# Patient Record
Sex: Female | Born: 1960
Health system: Southern US, Community
[De-identification: ages and names within clinical notes are randomized; demographics above are authoritative.]

## PROBLEM LIST (undated history)

## (undated) DIAGNOSIS — I10 Essential (primary) hypertension: Secondary | ICD-10-CM

## (undated) DIAGNOSIS — R600 Localized edema: Secondary | ICD-10-CM

## (undated) DIAGNOSIS — E669 Obesity, unspecified: Secondary | ICD-10-CM

## (undated) DIAGNOSIS — D6851 Activated protein C resistance: Secondary | ICD-10-CM

## (undated) DIAGNOSIS — F419 Anxiety disorder, unspecified: Secondary | ICD-10-CM

## (undated) DIAGNOSIS — N62 Hypertrophy of breast: Secondary | ICD-10-CM

## (undated) DIAGNOSIS — K219 Gastro-esophageal reflux disease without esophagitis: Secondary | ICD-10-CM

## (undated) HISTORY — DX: Obesity, unspecified: E66.9

## (undated) HISTORY — PX: ROTATOR CUFF REPAIR: SHX139

## (undated) HISTORY — DX: Localized edema: R60.0

## (undated) HISTORY — DX: Hypertrophy of breast: N62

---

## 2003-02-23 ENCOUNTER — Ambulatory Visit (HOSPITAL_BASED_OUTPATIENT_CLINIC_OR_DEPARTMENT_OTHER): Admission: RE | Admit: 2003-02-23 | Discharge: 2003-02-23 | Payer: Self-pay | Admitting: Orthopedic Surgery

## 2004-05-30 ENCOUNTER — Ambulatory Visit (HOSPITAL_COMMUNITY): Admission: RE | Admit: 2004-05-30 | Discharge: 2004-05-30 | Payer: Self-pay | Admitting: Orthopedic Surgery

## 2004-05-30 ENCOUNTER — Ambulatory Visit (HOSPITAL_BASED_OUTPATIENT_CLINIC_OR_DEPARTMENT_OTHER): Admission: RE | Admit: 2004-05-30 | Discharge: 2004-05-30 | Payer: Self-pay | Admitting: Orthopedic Surgery

## 2007-10-17 HISTORY — PX: ABDOMINAL HYSTERECTOMY: SHX81

## 2007-12-15 ENCOUNTER — Ambulatory Visit: Payer: Self-pay | Admitting: Internal Medicine

## 2007-12-16 ENCOUNTER — Ambulatory Visit: Payer: Self-pay | Admitting: Internal Medicine

## 2007-12-23 ENCOUNTER — Ambulatory Visit: Payer: Self-pay | Admitting: Unknown Physician Specialty

## 2007-12-23 ENCOUNTER — Other Ambulatory Visit: Payer: Self-pay

## 2007-12-26 ENCOUNTER — Inpatient Hospital Stay: Payer: Self-pay | Admitting: Unknown Physician Specialty

## 2008-01-15 ENCOUNTER — Ambulatory Visit: Payer: Self-pay | Admitting: Internal Medicine

## 2008-09-04 ENCOUNTER — Ambulatory Visit: Payer: Self-pay

## 2009-12-24 ENCOUNTER — Ambulatory Visit: Payer: Self-pay | Admitting: Internal Medicine

## 2010-04-22 ENCOUNTER — Ambulatory Visit: Payer: Self-pay | Admitting: Unknown Physician Specialty

## 2011-10-12 ENCOUNTER — Emergency Department: Payer: Self-pay | Admitting: Emergency Medicine

## 2011-11-22 ENCOUNTER — Ambulatory Visit: Payer: Self-pay | Admitting: Obstetrics and Gynecology

## 2015-03-24 ENCOUNTER — Ambulatory Visit
Admission: EM | Admit: 2015-03-24 | Discharge: 2015-03-24 | Disposition: A | Discharge status: Home / Self Care | Payer: BLUE CROSS/BLUE SHIELD | Attending: Family Medicine | Admitting: Family Medicine

## 2015-03-24 ENCOUNTER — Other Ambulatory Visit: Payer: Self-pay

## 2015-03-24 DIAGNOSIS — F419 Anxiety disorder, unspecified: Secondary | ICD-10-CM | POA: Diagnosis not present

## 2015-03-24 DIAGNOSIS — R12 Heartburn: Secondary | ICD-10-CM | POA: Diagnosis not present

## 2015-03-24 DIAGNOSIS — J0101 Acute recurrent maxillary sinusitis: Secondary | ICD-10-CM | POA: Diagnosis not present

## 2015-03-24 DIAGNOSIS — R42 Dizziness and giddiness: Secondary | ICD-10-CM | POA: Diagnosis present

## 2015-03-24 DIAGNOSIS — K219 Gastro-esophageal reflux disease without esophagitis: Secondary | ICD-10-CM | POA: Diagnosis not present

## 2015-03-24 DIAGNOSIS — I10 Essential (primary) hypertension: Secondary | ICD-10-CM | POA: Insufficient documentation

## 2015-03-24 DIAGNOSIS — H6593 Unspecified nonsuppurative otitis media, bilateral: Secondary | ICD-10-CM | POA: Diagnosis not present

## 2015-03-24 HISTORY — DX: Anxiety disorder, unspecified: F41.9

## 2015-03-24 HISTORY — DX: Essential (primary) hypertension: I10

## 2015-03-24 HISTORY — DX: Gastro-esophageal reflux disease without esophagitis: K21.9

## 2015-03-24 MED ORDER — GI COCKTAIL ~~LOC~~
30.0000 mL | Freq: Once | ORAL | Status: AC
  Administered 2015-03-24: 30 mL via ORAL

## 2015-03-24 MED ORDER — MECLIZINE HCL 25 MG PO TABS
25.0000 mg | ORAL_TABLET | ORAL | Status: AC
  Administered 2015-03-24: 12.5 mg via ORAL

## 2015-03-24 MED ORDER — ONDANSETRON HCL 4 MG PO TABS: 4.0000 mg | ORAL_TABLET | Freq: Three times a day (TID) | ORAL | Status: DC | PRN

## 2015-03-24 MED ORDER — FLUTICASONE PROPIONATE 50 MCG/ACT NA SUSP: 1.0000 | Freq: Two times a day (BID) | NASAL | Status: DC

## 2015-03-24 MED ORDER — MECLIZINE HCL 25 MG PO TABS: 25.0000 mg | ORAL_TABLET | Freq: Three times a day (TID) | ORAL | Status: AC | PRN

## 2015-03-24 NOTE — ED Notes (Signed)
"  I think I have vertigo" Woke with headache above left eye. Dizzy and nauseated at work. Discomfort above left breast.

## 2015-03-24 NOTE — Discharge Instructions (Signed)
Otitis Media With Effusion Otitis media with effusion is the presence of fluid in the middle ear. This is a common problem in children, which often follows ear infections. It may be present for weeks or longer after the infection. Unlike an acute ear infection, otitis media with effusion refers only to fluid behind the ear drum and not infection. Children with repeated ear and sinus infections and allergy problems are the most likely to get otitis media with effusion. CAUSES  The most frequent cause of the fluid buildup is dysfunction of the eustachian tubes. These are the tubes that drain fluid in the ears to the back of the nose (nasopharynx). SYMPTOMS   The main symptom of this condition is hearing loss. As a result, you or your child may:  Listen to the TV at a loud volume.  Not respond to questions.  Ask "what" often when spoken to.  Mistake or confuse one sound or word for another.  There may be a sensation of fullness or pressure but usually not pain. DIAGNOSIS   Your health care provider will diagnose this condition by examining you or your child's ears.  Your health care provider may test the pressure in you or your child's ear with a tympanometer.  A hearing test may be conducted if the problem persists. TREATMENT   Treatment depends on the duration and the effects of the effusion.  Antibiotics, decongestants, nose drops, and cortisone-type drugs (tablets or nasal spray) may not be helpful.  Children with persistent ear effusions may have delayed language or behavioral problems. Children at risk for developmental delays in hearing, learning, and speech may require referral to a specialist earlier than children not at risk.  You or your child's health care provider may suggest a referral to an ear, nose, and throat surgeon for treatment. The following may help restore normal hearing:  Drainage of fluid.  Placement of ear tubes (tympanostomy tubes).  Removal of adenoids  (adenoidectomy). HOME CARE INSTRUCTIONS   Avoid secondhand smoke.  Infants who are breastfed are less likely to have this condition.  Avoid feeding infants while they are lying flat.  Avoid known environmental allergens.  Avoid people who are sick. SEEK MEDICAL CARE IF:   Hearing is not better in 3 months.  Hearing is worse.  Ear pain.  Drainage from the ear.  Dizziness. MAKE SURE YOU:   Understand these instructions.  Will watch your condition.  Will get help right away if you are not doing well or get worse. Document Released: 11/09/2004 Document Revised: 02/16/2014 Document Reviewed: 04/29/2013 [facility] Patient Information 2015 [place], [place]. This information is not intended to replace advice given to you by your health care provider. Make sure you discuss any questions you have with your health care provider. Sinusitis Sinusitis is redness, soreness, and inflammation of the paranasal sinuses. Paranasal sinuses are air pockets within the bones of your face (beneath the eyes, the middle of the forehead, or above the eyes). In healthy paranasal sinuses, mucus is able to drain out, and air is able to circulate through them by way of your nose. However, when your paranasal sinuses are inflamed, mucus and air can become trapped. This can allow bacteria and other germs to grow and cause infection. Sinusitis can develop quickly and last only a short time (acute) or continue over a long period (chronic). Sinusitis that lasts for more than 12 weeks is considered chronic.  CAUSES  Causes of sinusitis include:  Allergies.  Structural abnormalities, such as  displacement of the cartilage that separates your nostrils (deviated septum), which can decrease the air flow through your nose and sinuses and affect sinus drainage.  Functional abnormalities, such as when the small hairs (cilia) that line your sinuses and help remove mucus do not work properly or are not present. SIGNS AND  SYMPTOMS  Symptoms of acute and chronic sinusitis are the same. The primary symptoms are pain and pressure around the affected sinuses. Other symptoms include:  Upper toothache.  Earache.  Headache.  Bad breath.  Decreased sense of smell and taste.  A cough, which worsens when you are lying flat.  Fatigue.  Fever.  Thick drainage from your nose, which often is green and may contain pus (purulent).  Swelling and warmth over the affected sinuses. DIAGNOSIS  Your health care provider will perform a physical exam. During the exam, your health care provider may:  Look in your nose for signs of abnormal growths in your nostrils (nasal polyps).  Tap over the affected sinus to check for signs of infection.  View the inside of your sinuses (endoscopy) using an imaging device that has a light attached (endoscope). If your health care provider suspects that you have chronic sinusitis, one or more of the following tests may be recommended:  Allergy tests.  Nasal culture. A sample of mucus is taken from your nose, sent to a lab, and screened for bacteria.  Nasal cytology. A sample of mucus is taken from your nose and examined by your health care provider to determine if your sinusitis is related to an allergy. TREATMENT  Most cases of acute sinusitis are related to a viral infection and will resolve on their own within 10 days. Sometimes medicines are prescribed to help relieve symptoms (pain medicine, decongestants, nasal steroid sprays, or saline sprays).  However, for sinusitis related to a bacterial infection, your health care provider will prescribe antibiotic medicines. These are medicines that will help kill the bacteria causing the infection.  Rarely, sinusitis is caused by a fungal infection. In theses cases, your health care provider will prescribe antifungal medicine. For some cases of chronic sinusitis, surgery is needed. Generally, these are cases in which sinusitis recurs  more than 3 times per year, despite other treatments. HOME CARE INSTRUCTIONS   Drink plenty of water. Water helps thin the mucus so your sinuses can drain more easily.  Use a humidifier.  Inhale steam 3 to 4 times a day (for example, sit in the bathroom with the shower running).  Apply a warm, moist washcloth to your face 3 to 4 times a day, or as directed by your health care provider.  Use saline nasal sprays to help moisten and clean your sinuses.  Take medicines only as directed by your health care provider.  If you were prescribed either an antibiotic or antifungal medicine, finish it all even if you start to feel better. SEEK IMMEDIATE MEDICAL CARE IF:  You have increasing pain or severe headaches.  You have nausea, vomiting, or drowsiness.  You have swelling around your face.  You have vision problems.  You have a stiff neck.  You have difficulty breathing. MAKE SURE YOU:   Understand these instructions.  Will watch your condition.  Will get help right away if you are not doing well or get worse. Document Released: 10/02/2005 Document Revised: 02/16/2014 Document Reviewed: 10/17/2011 [facility] Patient Information 2015 [place], [place]. This information is not intended to replace advice given to you by your health care provider. Make sure  you discuss any questions you have with your health care provider. Heartburn Heartburn is a painful, burning sensation in the chest. It may feel worse in certain positions, such as lying down or bending over. It is caused by stomach acid backing up into the tube that carries food from the mouth down to the stomach (lower esophagus).  CAUSES   Large meals.  Certain foods and drinks.  Exercise.  Increased acid production.  Being overweight or obese.  Certain medicines. SYMPTOMS   Burning pain in the chest or lower throat.  Bitter taste in the mouth.  Coughing. DIAGNOSIS  If the usual treatments for heartburn do not  improve your symptoms, then tests may be done to see if there is another condition present. Possible tests may include:  X-rays.  Endoscopy. This is when a tube with a light and a camera on the end is used to examine the esophagus and the stomach.  A test to measure the amount of acid in the esophagus (pH test).  A test to see if the esophagus is working properly (esophageal manometry).  Blood, breath, or stool tests to check for bacteria that cause ulcers. TREATMENT   Your caregiver may tell you to use certain over-the-counter medicines (antacids, acid reducers) for mild heartburn.  Your caregiver may prescribe medicines to decrease the acid in your stomach or protect your stomach lining.  Your caregiver may recommend certain diet changes.  For severe cases, your caregiver may recommend that the head of your bed be elevated on blocks. (Sleeping with more pillows is not an effective treatment as it only changes the position of your head and does not improve the main problem of stomach acid refluxing into the esophagus.) HOME CARE INSTRUCTIONS   Take all medicines as directed by your caregiver.  Raise the head of your bed by putting blocks under the legs if instructed to by your caregiver.  Do not exercise right after eating.  Avoid eating 2 or 3 hours before bed. Do not lie down right after eating.  Eat small meals throughout the day instead of 3 large meals.  Stop smoking if you smoke.  Maintain a healthy weight.  Identify foods and beverages that make your symptoms worse and avoid them. Foods you may want to avoid include:  Peppers.  Chocolate.  High-fat foods, including fried foods.  Spicy foods.  Garlic and onions.  Citrus fruits, including oranges, grapefruit, lemons, and limes.  Food containing tomatoes or tomato products.  Mint.  Carbonated drinks, caffeinated drinks, and alcohol.  Vinegar. SEEK IMMEDIATE MEDICAL CARE IF:  You have severe chest pain  that goes down your arm or into your jaw or neck.  You feel sweaty, dizzy, or lightheaded.  You are short of breath.  You vomit blood.  You have difficulty or pain with swallowing.  You have bloody or black, tarry stools.  You have episodes of heartburn more than 3 times a week for more than 2 weeks. MAKE SURE YOU:  Understand these instructions.  Will watch your condition.  Will get help right away if you are not doing well or get worse. Document Released: 02/18/2009 Document Revised: 12/25/2011 Document Reviewed: 03/19/2011 [facility] Patient Information 2015 [place], [place]. This information is not intended to replace advice given to you by your health care provider. Make sure you discuss any questions you have with your health care provider. Vertigo Vertigo means you feel like you or your surroundings are moving when they are not. Vertigo can be  dangerous if it occurs when you are at work, driving, or performing difficult activities.  CAUSES  Vertigo occurs when there is a conflict of signals sent to your brain from the visual and sensory systems in your body. There are many different causes of vertigo, including:  Infections, especially in the inner ear.  A bad reaction to a drug or misuse of alcohol and medicines.  Withdrawal from drugs or alcohol.  Rapidly changing positions, such as lying down or rolling over in bed.  A migraine headache.  Decreased blood flow to the brain.  Increased pressure in the brain from a head injury, infection, tumor, or bleeding. SYMPTOMS  You may feel as though the world is spinning around or you are falling to the ground. Because your balance is upset, vertigo can cause nausea and vomiting. You may have involuntary eye movements (nystagmus). DIAGNOSIS  Vertigo is usually diagnosed by physical exam. If the cause of your vertigo is unknown, your caregiver may perform imaging tests, such as an MRI scan (magnetic resonance imaging). TREATMENT   Most cases of vertigo resolve on their own, without treatment. Depending on the cause, your caregiver may prescribe certain medicines. If your vertigo is related to body position issues, your caregiver may recommend movements or procedures to correct the problem. In rare cases, if your vertigo is caused by certain inner ear problems, you may need surgery. HOME CARE INSTRUCTIONS   Follow your caregiver's instructions.  Avoid driving.  Avoid operating heavy machinery.  Avoid performing any tasks that would be dangerous to you or others during a vertigo episode.  Tell your caregiver if you notice that certain medicines seem to be causing your vertigo. Some of the medicines used to treat vertigo episodes can actually make them worse in some people. SEEK IMMEDIATE MEDICAL CARE IF:   Your medicines do not relieve your vertigo or are making it worse.  You develop problems with talking, walking, weakness, or using your arms, hands, or legs.  You develop severe headaches.  Your nausea or vomiting continues or gets worse.  You develop visual changes.  A family member notices behavioral changes.  Your condition gets worse. MAKE SURE YOU:  Understand these instructions.  Will watch your condition.  Will get help right away if you are not doing well or get worse. Document Released: 07/12/2005 Document Revised: 12/25/2011 Document Reviewed: 04/20/2011 [facility] Patient Information 2015 [place], [place]. This information is not intended to replace advice given to you by your health care provider. Make sure you discuss any questions you have with your health care provider.

## 2015-03-24 NOTE — ED Provider Notes (Signed)
CSN: [id number]     Arrival date & time 03/24/15  1321 History   First MD Initiated Contact with Patient 03/24/15 1418     Chief Complaint  Patient presents with  . Dizziness   (Consider location/radiation/quality/duration/timing/severity/associated sxs/prior Treatment) HPI Comments: Caucasian female with anxiety unable to reach [facility] Ronnald Ramp after she started to have vertigo, headache, heartburn, dizzyness at work.  Works at [employer] in [place], [place]. This vertigo a bit different than last episode 7 years ago as chest feels like something off not chest pain like fluttering.  That episode vertigo came on suddenly only symptom was given meclizine and resolved same day.  Had sinus problem one month ago took OTC flonase, sudafed resolved.  Denied recent cough, cold.  I am healthy except for being overweight.  Patient is a 54 y.o. female presenting with dizziness. The history is provided by the patient.  Dizziness Quality:  Head spinning and lightheadedness Severity:  Moderate Onset quality:  Sudden Duration:  4 hours Timing:  Constant Progression:  Unchanged Chronicity:  New Context: head movement and standing up   Context: not when bending over, not with bowel movement, not with ear pain, not with eye movement, not with inactivity, not with loss of consciousness, not with medication, not with physical activity and not when urinating   Relieved by:  Nothing Worsened by:  Movement Ineffective treatments:  Change in position, being still, closing eyes, lying down, food and fluids Associated symptoms: chest pain, headaches and nausea   Associated symptoms: no blood in stool, no diarrhea, no hearing loss, no palpitations, no shortness of breath, no syncope, no tinnitus, no vision changes, no vomiting and no weakness   Chest pain:    Quality: pressure     Severity:  Mild   Onset quality:  Sudden   Duration:  4 hours   Timing:  Constant   Progression:  Unchanged   Chronicity:  New Headaches:     Severity:  Mild   Onset quality:  Sudden   Duration:  7 hours   Timing:  Constant   Progression:  Unchanged   Chronicity:  New Nausea:    Severity:  Moderate   Onset quality:  Sudden   Duration:  6 hours   Timing:  Constant   Progression:  Unchanged Risk factors: hx of vertigo   Risk factors: no anemia, no heart disease, no hx of stroke, no Meniere's disease, no multiple medications and no new medications     Past Medical History  Diagnosis Date  . Hypertension   . GERD (gastroesophageal reflux disease)   . Anxiety    Past Surgical History  Procedure Laterality Date  . Abdominal hysterectomy    . Rotator cuff repair     Family History  Problem Relation Age of Onset  . Cancer Mother   . COPD Father    History  Substance Use Topics  . Smoking status: Never Smoker   . Smokeless tobacco: Not on file  . Alcohol Use: Yes     Comment: socially   OB History    No data available     Review of Systems  Constitutional: Negative for fever, chills, diaphoresis, activity change, appetite change and fatigue.  HENT: Negative for congestion, dental problem, drooling, ear discharge, ear pain, facial swelling, hearing loss, mouth sores, nosebleeds, postnasal drip, rhinorrhea, sinus pressure, sneezing, sore throat, tinnitus, trouble swallowing and voice change.   Eyes: Negative for photophobia, pain, discharge, redness, itching and visual disturbance.  Respiratory: Negative for apnea, cough, choking, chest tightness, shortness of breath, wheezing and stridor.   Cardiovascular: Positive for chest pain. Negative for palpitations, leg swelling and syncope.  Gastrointestinal: Positive for nausea. Negative for vomiting, abdominal pain, diarrhea, constipation, blood in stool, abdominal distention, anal bleeding and rectal pain.  Endocrine: Negative for cold intolerance and heat intolerance.  Genitourinary: Negative for dysuria and difficulty urinating.  Musculoskeletal: Negative for  myalgias, back pain, joint swelling, arthralgias, gait problem, neck pain and neck stiffness.  Skin: Negative for color change, pallor, rash and wound.  Allergic/Immunologic: Negative for environmental allergies and food allergies.  Neurological: Positive for dizziness and headaches. Negative for tremors, seizures, syncope, facial asymmetry, speech difficulty, weakness, light-headedness and numbness.  Hematological: Negative for adenopathy. Does not bruise/bleed easily.  Psychiatric/Behavioral: Negative for hallucinations, behavioral problems, confusion, sleep disturbance, self-injury, dysphoric mood, decreased concentration and agitation. The patient is nervous/anxious. The patient is not hyperactive.     Allergies  Review of patient's allergies indicates no known allergies.  Home Medications   Prior to Admission medications   Medication Sig Start Date End Date Taking? Authorizing Provider  ALPRAZolam Duanne Moron) 0.25 MG tablet Take 0.25 mg by mouth at bedtime as needed for anxiety.   Yes Historical Provider, MD  esomeprazole (NEXIUM) 40 MG capsule Take 40 mg by mouth daily at 12 noon.   Yes Historical Provider, MD  lisinopril (PRINIVIL,ZESTRIL) 2.5 MG tablet Take 2.5 mg by mouth daily.   Yes Historical Provider, MD  fluticasone (FLONASE) 50 MCG/ACT nasal spray Place 1 spray into both nostrils 2 (two) times daily. 03/24/15   Olen Cordial, NP  meclizine (ANTIVERT) 25 MG tablet Take 1 tablet (25 mg total) by mouth 3 (three) times daily as needed for dizziness (take at onset max 100mg  per 24 hrs). 03/24/15 03/31/15  Olen Cordial, NP  ondansetron (ZOFRAN) 4 MG tablet Take 1 tablet (4 mg total) by mouth every 8 (eight) hours as needed for nausea or vomiting. 03/24/15   Aura Fey Betancourt, NP   BP 149/84 mmHg  Pulse 96  Temp(Src) 98.4 F (36.9 C) (Oral)  Resp 18  Ht 5\' 3"  (1.6 m)  Wt 230 lb (104.327 kg)  BMI 40.75 kg/m2  SpO2 100% Physical Exam  Constitutional: She is oriented to person,  place, and time. Vital signs are normal. She appears well-developed and well-nourished. She is active.  Non-toxic appearance. She does not have a sickly appearance. She does not appear ill. No distress.  HENT:  Head: Normocephalic and atraumatic.  Right Ear: Hearing, external ear and ear canal normal. A middle ear effusion is present.  Left Ear: Hearing, external ear and ear canal normal. A middle ear effusion is present.  Nose: No mucosal edema, rhinorrhea, nose lacerations, sinus tenderness, nasal deformity, septal deviation or nasal septal hematoma. No epistaxis.  No foreign bodies. Right sinus exhibits maxillary sinus tenderness. Right sinus exhibits no frontal sinus tenderness. Left sinus exhibits maxillary sinus tenderness. Left sinus exhibits no frontal sinus tenderness.  Mouth/Throat: Uvula is midline and mucous membranes are normal. She does not have dentures. No oral lesions. No trismus in the jaw. Normal dentition. No dental abscesses, uvula swelling, lacerations or dental caries. Posterior oropharyngeal edema and posterior oropharyngeal erythema present. No oropharyngeal exudate or tonsillar abscesses.  Bilateral TMs with air fluid level clear; cobblestoning posterior pharynx  Eyes: Conjunctivae, EOM and lids are normal. Pupils are equal, round, and reactive to light. Right eye exhibits no discharge. Left eye exhibits no discharge.  No scleral icterus.  Neck: Trachea normal and normal range of motion. Neck supple. No JVD present. No tracheal deviation present. No thyromegaly present.  Cardiovascular: Normal rate, regular rhythm, normal heart sounds and intact distal pulses.  Exam reveals no gallop and no friction rub.   No murmur heard. Pulmonary/Chest: Effort normal and breath sounds normal. No stridor. No respiratory distress. She has no wheezes. She has no rales. She exhibits no tenderness.  Abdominal: Soft. Bowel sounds are normal. She exhibits no shifting dullness, no distension, no  pulsatile liver, no fluid wave, no abdominal bruit, no ascites, no pulsatile midline mass and no mass. There is no hepatosplenomegaly. There is no tenderness. There is no rigidity, no rebound, no guarding, no CVA tenderness, no tenderness at McBurney's point and negative Murphy's sign. Hernia confirmed negative in the ventral area.  Dull to percussion x 4 quads  Musculoskeletal: Normal range of motion. She exhibits no edema or tenderness.  Lymphadenopathy:    She has no cervical adenopathy.  Neurological: She is alert and oriented to person, place, and time. Coordination normal.  Skin: Skin is warm, dry and intact. No rash noted. She is not diaphoretic. No erythema. No pallor.  Psychiatric: She has a normal mood and affect. Her speech is normal and behavior is normal. Judgment and thought content normal. Cognition and memory are normal.  Nursing note and vitals reviewed.   ED Course  ED EKG  Date/Time: 03/24/2015 2:42 PM Performed by: Gerarda Fraction A Authorized by: Sherlene Shams Interpreted by ED physician Comparison: compared with previous ECG from 12/23/2007 Comparison to previous ECG: Previous NSR nonspecific ST abnormality vent rate 75; PR interval 134; QRS duration 80 QT QTc 382/427 PRT axis 36 65 35; new today possible left atrial enlargement stable NSR nonspecific ST abnormalmity vent rate 88 PR interval 138 QRS duration 76 QT/QTc 376/454 PR axes 64 48 30 Rate: normal QRS axis: normal Clinical impression: non-specific ECG   (including critical care time) Labs Review Labs Reviewed - No data to display  Imaging Review No results found. Patient feeling better after meclizine and gi cocktail.  Still slight dizzyness but much improved.  Patient urinated without difficulty.  Refused offer of zofran/nausea medication.  Preferred paper Rx in case she needs at home later.  Work excuse for rest of day.  Follow up ER if worsening chest discomfort, syncope, worst headache of life,  vomiting/urinating/stooling blood.  Patient verbalized understanding of information/instructions, agreed with plan of care and had no further questions at this time.  MDM   1. Anxiety   2. Heartburn   3. Vertigo   4. Otitis media with effusion, bilateral   5. Acute recurrent maxillary sinusitis    Continue medications per PCM.  Exercise during the day--continue walking.  Discussed sleep hygiene, maintaining routine.  No S/I or H/I, plan, or ideation.  Patient is reliable.  Discussed other resources patient may use if symptoms worsen EMERGENCY ROOM, chaplain, PCM on call or [facility] [place].  Exitcare handout on insomnia and anxiety given to patient.   Return to the clinic if any new or worsening symptoms.  Patient verbalized understanding of information/instructions, agreed with plan of care and had no further questions at this time. Continue nexium as prescribed by PCM.  Exitcare handout on heartburn given to patient. Anti-reflux measures such as raising the head of the bed, avoiding tight clothing or belts, avoiding eating late at night and not lying down shortly after mealtime and achieving weight loss were  discussed. Avoid ASA, NSAID's, caffeine, peppermints, alcohol and tobacco. OTC H2 blockers and/or antacids are often very helpful for PRN use.  However, for chronic or daily symptoms, prescription strength H2 blockers or a trial of PPI's should be used.  Patient should alert me if there are persistent symptoms, dysphagia, weight loss or GI bleeding (bright red or black). Follow up with clinic provider or PCM if symptoms persist despite therapy. Patient verbalized agreement and understanding of treatment plan and had no further questions at this time.   P2:  Diet, Food avoidance that aggravate condition, and Fitness Discussed with patient otitis media with effusion probably causing vertigo but could also be age.  Meclizine 25mg  po x 1 helpful in clinic.  Supportive treatment may take up to 4  doses meclizine per day max 100mg  per 24 hours.  Discussed signs/symptoms stroke.  Neighbor to drive her home recommended not driving during vertigo episodes.  Follow up if aphasia, dysphasia, visual changes, weakness, fall, worst headache of life, incoordination, fever, ear discharge.  Consider ENT evaluation/follow up with PCM if worsening symptoms not controlled with meclizine or needs Rx refill.  Patient verbalized understanding of information/agreed with plan of care and had no further questions at this time.   Supportive treatment.   No evidence of invasive bacterial infection, non toxic and well hydrated.  This is most likely self limiting viral infection.  I do not see where any further testing or imaging is necessary at this time.   I will suggest supportive care, rest, good hygiene and encourage the patient to take adequate fluids.  The patient is to return to clinic or EMERGENCY ROOM if symptoms worsen or change significantly e.g. ear pain, fever, purulent discharge from ears or bleeding.  Exitcare handout on otitis media with effusion given to patient.  Patient verbalized agreement and understanding of treatment plan.   Restart flonase 1 spray each nostril BID. Nasal saline 2 sprays each nostril q2h while awake.  No evidence of systemic bacterial infection, non toxic and well hydrated.  I do not see where any further testing or imaging is necessary at this time.   I will suggest supportive care, rest, good hygiene and encourage the patient to take adequate fluids.  The patient is to return to clinic or EMERGENCY ROOM if symptoms worsen or change significantly.  Exitcare handout on sinusitis given to patient.  Patient verbalized agreement and understanding of treatment plan and had no further questions at this time.   P2:  Hand washing and cover cough    Olen Cordial, NP 03/24/15 1514

## 2015-05-05 ENCOUNTER — Other Ambulatory Visit: Payer: Self-pay | Admitting: Family Medicine

## 2015-05-05 DIAGNOSIS — F419 Anxiety disorder, unspecified: Secondary | ICD-10-CM

## 2015-08-27 ENCOUNTER — Ambulatory Visit (INDEPENDENT_AMBULATORY_CARE_PROVIDER_SITE_OTHER): Payer: BLUE CROSS/BLUE SHIELD | Admitting: Family Medicine

## 2015-08-27 ENCOUNTER — Encounter: Payer: Self-pay | Admitting: Family Medicine

## 2015-08-27 VITALS — BP 102/80 | HR 80 | Ht 63.0 in | Wt 230.0 lb

## 2015-08-27 DIAGNOSIS — Z0000 Encounter for general adult medical examination without abnormal findings: Secondary | ICD-10-CM | POA: Diagnosis not present

## 2015-08-27 DIAGNOSIS — Z1211 Encounter for screening for malignant neoplasm of colon: Secondary | ICD-10-CM | POA: Diagnosis not present

## 2015-08-27 DIAGNOSIS — Z124 Encounter for screening for malignant neoplasm of cervix: Secondary | ICD-10-CM | POA: Diagnosis not present

## 2015-08-27 DIAGNOSIS — Z1231 Encounter for screening mammogram for malignant neoplasm of breast: Secondary | ICD-10-CM | POA: Diagnosis not present

## 2015-08-27 DIAGNOSIS — F419 Anxiety disorder, unspecified: Secondary | ICD-10-CM | POA: Diagnosis not present

## 2015-08-27 DIAGNOSIS — D229 Melanocytic nevi, unspecified: Secondary | ICD-10-CM

## 2015-08-27 LAB — HEMOCCULT GUIAC POC 1CARD (OFFICE): Fecal Occult Blood, POC: NEGATIVE

## 2015-08-27 MED ORDER — ALPRAZOLAM 0.25 MG PO TABS: 0.2500 mg | ORAL_TABLET | Freq: Every day | ORAL | Status: DC | PRN

## 2015-08-27 NOTE — Progress Notes (Signed)
Name: Krista Harrison   MRN: [id number]    DOB: 30-Jan-1961   Date:08/27/2015       Progress Note  Subjective  Chief Complaint  Chief Complaint  Patient presents with  . Annual Exam    pap    HPI Comments: Patient for physical with no significant subjective/objective concerns.   No problem-specific assessment & plan notes found for this encounter.   Past Medical History  Diagnosis Date  . Hypertension   . GERD (gastroesophageal reflux disease)   . Anxiety     Past Surgical History  Procedure Laterality Date  . Abdominal hysterectomy    . Rotator cuff repair Bilateral     Family History  Problem Relation Age of Onset  . Cancer Mother   . COPD Father     Social History   Social History  . Marital Status: Married    Spouse Name: N/A  . Number of Children: N/A  . Years of Education: N/A   Occupational History  . Not on file.   Social History Main Topics  . Smoking status: Never Smoker   . Smokeless tobacco: Not on file  . Alcohol Use: 0.0 oz/week    0 Standard drinks or equivalent per week     Comment: socially  . Drug Use: No  . Sexual Activity: Not on file   Other Topics Concern  . Not on file   Social History Narrative    No Known Allergies   Review of Systems  Constitutional: Positive for weight loss. Negative for fever, chills and malaise/fatigue.       Weight loss on ketone diet  HENT: Negative for ear discharge, ear pain, nosebleeds, sore throat and tinnitus.   Eyes: Negative for blurred vision, photophobia, pain, discharge and redness.  Respiratory: Negative for cough, sputum production, shortness of breath and wheezing.   Cardiovascular: Negative for chest pain, palpitations, leg swelling and PND.  Gastrointestinal: Negative for heartburn, nausea, vomiting, abdominal pain, diarrhea, constipation, blood in stool and melena.  Genitourinary: Negative for dysuria, urgency, frequency and hematuria.  Musculoskeletal: Negative for myalgias,  back pain, joint pain and neck pain.  Skin: Negative for rash.       Place on right shoulder / pigmented   Neurological: Negative for dizziness, tingling, sensory change, focal weakness and headaches.  Endo/Heme/Allergies: Negative for environmental allergies and polydipsia. Does not bruise/bleed easily.  Psychiatric/Behavioral: Negative for depression and suicidal ideas. The patient is nervous/anxious. The patient does not have insomnia.      Objective  Filed Vitals:   08/27/15 0946  BP: 102/80  Pulse: 80  Height: 5\' 3"  (1.6 m)  Weight: 230 lb (104.327 kg)    Physical Exam  Constitutional: She is well-developed, well-nourished, and in no distress. No distress.  HENT:  Head: Normocephalic and atraumatic.  Right Ear: External ear normal.  Left Ear: External ear normal.  Nose: Nose normal.  Mouth/Throat: Oropharynx is clear and moist.  Eyes: Conjunctivae and EOM are normal. Pupils are equal, round, and reactive to light. Right eye exhibits no discharge. Left eye exhibits no discharge.  Neck: Normal range of motion. Neck supple. No JVD present. No thyromegaly present.  Cardiovascular: Normal rate, regular rhythm, normal heart sounds and intact distal pulses.  Exam reveals no gallop and no friction rub.   No murmur heard. Pulmonary/Chest: Effort normal and breath sounds normal. Right breast exhibits no inverted nipple, no mass, no nipple discharge, no skin change and no tenderness. Left breast exhibits no inverted  nipple, no mass, no nipple discharge, no skin change and no tenderness. Breasts are symmetrical.  Abdominal: Soft. Bowel sounds are normal. She exhibits no mass. There is no tenderness. There is no guarding.  Genitourinary: Rectum normal, vagina normal, uterus normal, cervix normal, right adnexa normal, left adnexa normal and vulva normal.  Musculoskeletal: Normal range of motion. She exhibits no edema.  Lymphadenopathy:    She has no cervical adenopathy.  Neurological: She  is alert. She has normal reflexes.  Skin: Skin is warm and dry. Lesion noted. No ecchymosis noted. She is not diaphoretic.     Purple macular slightly scaly/ with irregular border  Psychiatric: Mood and affect normal.      Assessment & Plan  Problem List Items Addressed This Visit    None    Visit Diagnoses    Annual physical exam    -  Primary    Relevant Orders    POCT Occult Blood Stool (Completed)    Lipid Profile    Renal Function Panel    Pap IG and HPV (high risk) DNA detection    Pigmented nevus        left shoulder    Relevant Orders    Ambulatory referral to Dermatology    Acute anxiety        Relevant Medications    ALPRAZolam (XANAX) 0.25 MG tablet    Anxiety        Relevant Medications    ALPRAZolam (XANAX) 0.25 MG tablet    Colon cancer screening        Relevant Orders    Ambulatory referral to Gastroenterology    Visit for screening mammogram        Relevant Orders    MM Digital Screening    Pap smear for cervical cancer screening        Relevant Orders    Pap IG and HPV (high risk) DNA detection         Dr. Buffey Zabinski [facility]  08/27/2015

## 2015-08-28 LAB — LIPID PANEL
Chol/HDL Ratio: 3.4 ratio units (ref 0.0–4.4)
Cholesterol, Total: 215 mg/dL — ABNORMAL HIGH (ref 100–199)
HDL: 64 mg/dL (ref >39)
LDL Calculated: 132 mg/dL — ABNORMAL HIGH (ref 0–99)
Triglycerides: 93 mg/dL (ref 0–149)
VLDL Cholesterol Cal: 19 mg/dL (ref 5–40)

## 2015-08-28 LAB — RENAL FUNCTION PANEL
Albumin: 4.6 g/dL (ref 3.5–5.5)
BUN/Creatinine Ratio: 21 (ref 9–23)
BUN: 16 mg/dL (ref 6–24)
CO2: 25 mmol/L (ref 18–29)
Calcium: 9.7 mg/dL (ref 8.7–10.2)
Chloride: 96 mmol/L — ABNORMAL LOW (ref 97–106)
Creatinine, Ser: 0.78 mg/dL (ref 0.57–1.00)
GFR calc Af Amer: 100 mL/min/{1.73_m2} (ref >59)
GFR calc non Af Amer: 87 mL/min/{1.73_m2} (ref >59)
Glucose: 92 mg/dL (ref 65–99)
Phosphorus: 3.5 mg/dL (ref 2.5–4.5)
Potassium: 5.1 mmol/L (ref 3.5–5.2)
Sodium: 142 mmol/L (ref 136–144)

## 2015-09-01 LAB — PAP IG AND HPV HIGH-RISK: PAP Smear Comment: 0

## 2015-09-02 ENCOUNTER — Other Ambulatory Visit: Payer: Self-pay

## 2015-09-02 ENCOUNTER — Telehealth: Payer: Self-pay | Admitting: Gastroenterology

## 2015-09-02 ENCOUNTER — Ambulatory Visit
Admission: RE | Admit: 2015-09-02 | Discharge: 2015-09-02 | Disposition: A | Discharge status: Home / Self Care | Payer: BLUE CROSS/BLUE SHIELD | Source: Ambulatory Visit | Attending: Family Medicine | Admitting: Family Medicine

## 2015-09-02 DIAGNOSIS — Z1231 Encounter for screening mammogram for malignant neoplasm of breast: Secondary | ICD-10-CM | POA: Diagnosis not present

## 2015-09-02 NOTE — Telephone Encounter (Signed)
Gastroenterology Pre-Procedure Review  Request Date: 09-30-2015 Requesting Physician: Dr.   PATIENT REVIEW QUESTIONS: The patient responded to the following health history questions as indicated:    1. Are you having any GI issues? no 2. Do you have a personal history of Polyps? no 3. Do you have a family history of Colon Cancer or Polyps? no 4. Diabetes Mellitus? no 5. Joint replacements in the past 12 months?no 6. Major health problems in the past 3 months?no 7. Any artificial heart valves, MVP, or defibrillator?no    MEDICATIONS & ALLERGIES:    Patient reports the following regarding taking any anticoagulation/antiplatelet therapy:   Plavix, Coumadin, Eliquis, Xarelto, Lovenox, Pradaxa, Brilinta, or Effient? no Aspirin? no  Patient confirms/reports the following medications:  Current Outpatient Prescriptions  Medication Sig Dispense Refill   ALPRAZolam (XANAX) 0.25 MG tablet Take 1 tablet (0.25 mg total) by mouth daily as needed. 30 tablet 0   esomeprazole (NEXIUM) 40 MG capsule Take 40 mg by mouth daily at 12 noon.     No current facility-administered medications for this visit.    Patient confirms/reports the following allergies:  No Known Allergies  No orders of the defined types were placed in this encounter.    AUTHORIZATION INFORMATION Primary Insurance: 1D#: Group #:  Secondary Insurance: 1D#: Group #:  SCHEDULE INFORMATION: Date: 09-30-2015 Time: Location:[facility]

## 2015-09-30 ENCOUNTER — Ambulatory Visit
Admission: RE | Admit: 2015-09-30 | Payer: BLUE CROSS/BLUE SHIELD | Source: Ambulatory Visit | Admitting: Gastroenterology

## 2015-09-30 ENCOUNTER — Encounter: Admission: RE | Payer: Self-pay | Source: Ambulatory Visit

## 2015-09-30 SURGERY — COLONOSCOPY WITH PROPOFOL
Anesthesia: Choice

## 2016-02-24 ENCOUNTER — Encounter: Payer: Self-pay | Admitting: Family Medicine

## 2016-02-24 ENCOUNTER — Ambulatory Visit (INDEPENDENT_AMBULATORY_CARE_PROVIDER_SITE_OTHER): Payer: BLUE CROSS/BLUE SHIELD | Admitting: Family Medicine

## 2016-02-24 VITALS — BP 124/102 | HR 70 | Ht 63.0 in | Wt 214.0 lb

## 2016-02-24 DIAGNOSIS — Z1211 Encounter for screening for malignant neoplasm of colon: Secondary | ICD-10-CM | POA: Diagnosis not present

## 2016-02-24 DIAGNOSIS — F410 Panic disorder [episodic paroxysmal anxiety] without agoraphobia: Secondary | ICD-10-CM

## 2016-02-24 DIAGNOSIS — F419 Anxiety disorder, unspecified: Secondary | ICD-10-CM

## 2016-02-24 DIAGNOSIS — K219 Gastro-esophageal reflux disease without esophagitis: Secondary | ICD-10-CM | POA: Diagnosis not present

## 2016-02-24 DIAGNOSIS — Z23 Encounter for immunization: Secondary | ICD-10-CM

## 2016-02-24 DIAGNOSIS — I10 Essential (primary) hypertension: Secondary | ICD-10-CM

## 2016-02-24 MED ORDER — ALPRAZOLAM 0.25 MG PO TABS
0.2500 mg | ORAL_TABLET | Freq: Every day | ORAL | Status: DC | PRN
Start: 2016-02-24 — End: 2016-09-25

## 2016-02-24 MED ORDER — LISINOPRIL-HYDROCHLOROTHIAZIDE 10-12.5 MG PO TABS
1.0000 | ORAL_TABLET | Freq: Every day | ORAL | Status: DC
Start: 2016-02-24 — End: 2016-05-01

## 2016-02-24 MED ORDER — PANTOPRAZOLE SODIUM 40 MG PO TBEC: 40.0000 mg | DELAYED_RELEASE_TABLET | Freq: Every day | ORAL | Status: DC

## 2016-02-24 NOTE — Progress Notes (Signed)
Name: Krista Harrison   MRN: [id number]    DOB: 02/27/1961   Date:02/24/2016       Progress Note  Subjective  Chief Complaint  Chief Complaint  Patient presents with  . Hypertension  . Gastroesophageal Reflux  . Anxiety    gets #30 to last 6 months    Hypertension This is a chronic problem. The current episode started more than 1 year ago. The problem has been waxing and waning since onset. The problem is controlled. Associated symptoms include anxiety. Pertinent negatives include no blurred vision, chest pain, headaches, malaise/fatigue, neck pain, orthopnea, palpitations, peripheral edema, PND, shortness of breath or sweats. Past treatments include nothing. The current treatment provides mild improvement. There are no compliance problems.  There is no history of angina, kidney disease, CAD/MI, CVA, heart failure, left ventricular hypertrophy, PVD, renovascular disease or retinopathy. There is no history of chronic renal disease or a hypertension causing med.  Gastroesophageal Reflux She reports no abdominal pain, no belching, no chest pain, no choking, no coughing, no dysphagia, no early satiety, no globus sensation, no heartburn, no hoarse voice, no nausea, no sore throat, no stridor, no tooth decay, no water brash or no wheezing. This is a chronic problem. The current episode started more than 1 year ago. The problem occurs occasionally. The symptoms are aggravated by certain foods. Pertinent negatives include no melena or weight loss.  Anxiety Presents for follow-up visit. Patient reports no chest pain, depressed mood, dizziness, insomnia, nausea, nervous/anxious behavior, palpitations, shortness of breath or suicidal ideas.   There is no history of anemia, CAD or depression.    No problem-specific assessment & plan notes found for this encounter.   Past Medical History  Diagnosis Date  . Hypertension   . GERD (gastroesophageal reflux disease)   . Anxiety     Past Surgical  History  Procedure Laterality Date  . Abdominal hysterectomy    . Rotator cuff repair Bilateral     Family History  Problem Relation Age of Onset  . Cancer Mother   . Breast cancer Mother 66  . COPD Father     Social History   Social History  . Marital Status: Married    Spouse Name: N/A  . Number of Children: N/A  . Years of Education: N/A   Occupational History  . Not on file.   Social History Main Topics  . Smoking status: Never Smoker   . Smokeless tobacco: Not on file  . Alcohol Use: 0.0 oz/week    0 Standard drinks or equivalent per week     Comment: socially  . Drug Use: No  . Sexual Activity: Not on file   Other Topics Concern  . Not on file   Social History Narrative    No Known Allergies   Review of Systems  Constitutional: Negative for fever, chills, weight loss and malaise/fatigue.  HENT: Negative for ear discharge, ear pain, hoarse voice and sore throat.   Eyes: Negative for blurred vision.  Respiratory: Negative for cough, sputum production, choking, shortness of breath and wheezing.   Cardiovascular: Negative for chest pain, palpitations, orthopnea, leg swelling and PND.  Gastrointestinal: Negative for heartburn, dysphagia, nausea, abdominal pain, diarrhea, constipation, blood in stool and melena.  Genitourinary: Negative for dysuria, urgency, frequency and hematuria.  Musculoskeletal: Negative for myalgias, back pain, joint pain and neck pain.  Skin: Negative for rash.  Neurological: Negative for dizziness, tingling, sensory change, focal weakness and headaches.  Endo/Heme/Allergies: Negative for environmental allergies  and polydipsia. Does not bruise/bleed easily.  Psychiatric/Behavioral: Negative for depression and suicidal ideas. The patient is not nervous/anxious and does not have insomnia.      Objective  Filed Vitals:   02/24/16 1529  BP: 124/102  Pulse: 70  Height: 5\' 3"  (1.6 m)  Weight: 214 lb (97.07 kg)    Physical Exam   Constitutional: She is well-developed, well-nourished, and in no distress. No distress.  HENT:  Head: Normocephalic and atraumatic.  Right Ear: External ear normal.  Left Ear: External ear normal.  Nose: Nose normal.  Mouth/Throat: Oropharynx is clear and moist.  Eyes: Conjunctivae and EOM are normal. Pupils are equal, round, and reactive to light. Right eye exhibits no discharge. Left eye exhibits no discharge.  Neck: Normal range of motion. Neck supple. No JVD present. No thyromegaly present.  Cardiovascular: Normal rate, regular rhythm, normal heart sounds and intact distal pulses.  Exam reveals no gallop and no friction rub.   No murmur heard. Pulmonary/Chest: Effort normal and breath sounds normal.  Abdominal: Soft. Bowel sounds are normal. She exhibits no mass. There is no tenderness. There is no guarding.  Musculoskeletal: Normal range of motion. She exhibits no edema.  Lymphadenopathy:    She has no cervical adenopathy.  Neurological: She is alert.  Skin: Skin is warm and dry. She is not diaphoretic.  Psychiatric: Mood and affect normal.  Nursing note and vitals reviewed.     Assessment & Plan  Problem List Items Addressed This Visit    None    Visit Diagnoses    Essential hypertension    -  Primary    Relevant Medications    lisinopril-hydrochlorothiazide (PRINZIDE,ZESTORETIC) 10-12.5 MG tablet    Episodic paroxysmal anxiety disorder        Relevant Medications    ALPRAZolam (XANAX) 0.25 MG tablet    Gastroesophageal reflux disease, esophagitis presence not specified        Relevant Medications    pantoprazole (PROTONIX) 40 MG tablet    Acute anxiety        Relevant Medications    ALPRAZolam (XANAX) 0.25 MG tablet    Colon cancer screening        Relevant Orders    Ambulatory referral to Gastroenterology    Need for Tdap vaccination        Relevant Orders    Tdap vaccine greater than or equal to 7yo IM (Completed)         Dr. Deanna Jones [facility]  02/24/2016

## 2016-05-01 ENCOUNTER — Telehealth: Payer: Self-pay

## 2016-05-01 DIAGNOSIS — I10 Essential (primary) hypertension: Secondary | ICD-10-CM

## 2016-05-01 MED ORDER — LISINOPRIL-HYDROCHLOROTHIAZIDE 10-12.5 MG PO TABS: 1.0000 | ORAL_TABLET | Freq: Every day | ORAL | Status: DC

## 2016-05-01 NOTE — Telephone Encounter (Signed)
Refill request for a 90 day supply of Lisinopril-HCTZ Medication refilled and sent to the pharmacy

## 2016-07-16 ENCOUNTER — Other Ambulatory Visit: Payer: Self-pay | Admitting: Family Medicine

## 2016-07-16 DIAGNOSIS — K219 Gastro-esophageal reflux disease without esophagitis: Secondary | ICD-10-CM

## 2016-07-26 ENCOUNTER — Other Ambulatory Visit: Payer: Self-pay

## 2016-07-31 ENCOUNTER — Other Ambulatory Visit: Payer: Self-pay | Admitting: Family Medicine

## 2016-07-31 DIAGNOSIS — Z1231 Encounter for screening mammogram for malignant neoplasm of breast: Secondary | ICD-10-CM

## 2016-09-25 ENCOUNTER — Ambulatory Visit
Admission: RE | Admit: 2016-09-25 | Discharge: 2016-09-25 | Disposition: A | Discharge status: Home / Self Care | Payer: BLUE CROSS/BLUE SHIELD | Source: Ambulatory Visit | Attending: Family Medicine | Admitting: Family Medicine

## 2016-09-25 ENCOUNTER — Encounter: Payer: Self-pay | Admitting: Family Medicine

## 2016-09-25 ENCOUNTER — Ambulatory Visit (INDEPENDENT_AMBULATORY_CARE_PROVIDER_SITE_OTHER): Payer: BLUE CROSS/BLUE SHIELD | Admitting: Family Medicine

## 2016-09-25 ENCOUNTER — Other Ambulatory Visit: Payer: Self-pay | Admitting: Family Medicine

## 2016-09-25 VITALS — BP 138/82 | HR 88 | Ht 64.0 in | Wt 201.0 lb

## 2016-09-25 DIAGNOSIS — F410 Panic disorder [episodic paroxysmal anxiety] without agoraphobia: Secondary | ICD-10-CM

## 2016-09-25 DIAGNOSIS — G8929 Other chronic pain: Secondary | ICD-10-CM

## 2016-09-25 DIAGNOSIS — E669 Obesity, unspecified: Secondary | ICD-10-CM

## 2016-09-25 DIAGNOSIS — Z0001 Encounter for general adult medical examination with abnormal findings: Secondary | ICD-10-CM | POA: Diagnosis not present

## 2016-09-25 DIAGNOSIS — Z1211 Encounter for screening for malignant neoplasm of colon: Secondary | ICD-10-CM

## 2016-09-25 DIAGNOSIS — F419 Anxiety disorder, unspecified: Secondary | ICD-10-CM

## 2016-09-25 DIAGNOSIS — M545 Low back pain: Secondary | ICD-10-CM

## 2016-09-25 DIAGNOSIS — Z0000 Encounter for general adult medical examination without abnormal findings: Secondary | ICD-10-CM

## 2016-09-25 DIAGNOSIS — Z1159 Encounter for screening for other viral diseases: Secondary | ICD-10-CM | POA: Diagnosis not present

## 2016-09-25 DIAGNOSIS — I10 Essential (primary) hypertension: Secondary | ICD-10-CM | POA: Diagnosis not present

## 2016-09-25 DIAGNOSIS — Z6830 Body mass index (BMI) 30.0-30.9, adult: Secondary | ICD-10-CM

## 2016-09-25 DIAGNOSIS — Z1231 Encounter for screening mammogram for malignant neoplasm of breast: Secondary | ICD-10-CM | POA: Diagnosis not present

## 2016-09-25 DIAGNOSIS — Z114 Encounter for screening for human immunodeficiency virus [HIV]: Secondary | ICD-10-CM

## 2016-09-25 DIAGNOSIS — K219 Gastro-esophageal reflux disease without esophagitis: Secondary | ICD-10-CM

## 2016-09-25 LAB — HEMOCCULT GUIAC POC 1CARD (OFFICE): Fecal Occult Blood, POC: NEGATIVE

## 2016-09-25 MED ORDER — PANTOPRAZOLE SODIUM 40 MG PO TBEC: 40.0000 mg | DELAYED_RELEASE_TABLET | Freq: Every day | ORAL | 1 refills | Status: DC

## 2016-09-25 MED ORDER — LISINOPRIL-HYDROCHLOROTHIAZIDE 10-12.5 MG PO TABS: 1.0000 | ORAL_TABLET | Freq: Every day | ORAL | 1 refills | Status: DC

## 2016-09-25 MED ORDER — ALPRAZOLAM 0.25 MG PO TABS: 0.2500 mg | ORAL_TABLET | Freq: Every day | ORAL | 0 refills | Status: DC | PRN

## 2016-09-25 NOTE — Progress Notes (Signed)
Name: JANIECE PION   MRN: [id number]    DOB: November 26, 1960   Date:09/25/2016       Progress Note  Subjective  Chief Complaint  Chief Complaint  Patient presents with  . Annual Exam    no pap- didn't keep colonoscopy appt    Hypertension  This is a chronic problem. The current episode started more than 1 year ago. The problem is unchanged. The problem is controlled. Associated symptoms include anxiety. Pertinent negatives include no blurred vision, chest pain, headaches, malaise/fatigue, neck pain, orthopnea, palpitations, peripheral edema, PND, shortness of breath or sweats. There are no associated agents to hypertension. There are no known risk factors for coronary artery disease. Past treatments include diuretics and ACE inhibitors. The current treatment provides mild improvement. There are no compliance problems.  There is no history of angina, kidney disease, CAD/MI, CVA, heart failure, left ventricular hypertrophy, PVD, renovascular disease or retinopathy. There is no history of chronic renal disease, coarctation of the aorta or a hypertension causing med.  Gastroesophageal Reflux  She reports no abdominal pain, no belching, no chest pain, no choking, no coughing, no dysphagia, no early satiety, no globus sensation, no heartburn, no hoarse voice, no nausea, no sore throat, no stridor, no tooth decay, no water brash or no wheezing. This is a recurrent problem. The current episode started more than 1 year ago. The problem occurs constantly. The problem has been gradually improving. The symptoms are aggravated by certain foods. Pertinent negatives include no anemia, fatigue, melena, muscle weakness, orthopnea or weight loss. There are no known risk factors. She has tried a PPI for the symptoms. The treatment provided mild relief.  Anxiety  Presents for follow-up visit. Symptoms include excessive worry and nervous/anxious behavior. Patient reports no chest pain, compulsions, confusion, decreased  concentration, depressed mood, dizziness, dry mouth, feeling of choking, hyperventilation, impotence, insomnia, irritability, malaise, muscle tension, nausea, obsessions, palpitations, panic, restlessness, shortness of breath or suicidal ideas. The severity of symptoms is mild. The quality of sleep is good.    Back Pain  This is a chronic problem. The current episode started in the past 7 days. The problem occurs constantly. The problem has been gradually improving since onset. The pain is present in the lumbar spine. The quality of the pain is described as aching. The pain is at a severity of 1/10. The pain is mild. The symptoms are aggravated by bending. Pertinent negatives include no abdominal pain, bladder incontinence, bowel incontinence, chest pain, dysuria, fever, headaches, leg pain, numbness, tingling or weight loss. She has tried nothing for the symptoms. The treatment provided mild relief.    No problem-specific Assessment & Plan notes found for this encounter.   Past Medical History:  Diagnosis Date  . Anxiety   . GERD (gastroesophageal reflux disease)   . Hypertension     Past Surgical History:  Procedure Laterality Date  . ABDOMINAL HYSTERECTOMY    . ROTATOR CUFF REPAIR Bilateral     Family History  Problem Relation Age of Onset  . Cancer Mother   . Breast cancer Mother 1  . COPD Father     Social History   Social History  . Marital status: Married    Spouse name: N/A  . Number of children: N/A  . Years of education: N/A   Occupational History  . Not on file.   Social History Main Topics  . Smoking status: Never Smoker  . Smokeless tobacco: Not on file  . Alcohol use 0.0 oz/week  Comment: socially  . Drug use: No  . Sexual activity: Not on file   Other Topics Concern  . Not on file   Social History Narrative  . No narrative on file    No Known Allergies   Review of Systems  Constitutional: Negative for chills, fatigue, fever, irritability,  malaise/fatigue and weight loss.  HENT: Negative for ear discharge, ear pain, hoarse voice and sore throat.   Eyes: Negative for blurred vision.  Respiratory: Negative for cough, sputum production, choking, shortness of breath and wheezing.   Cardiovascular: Negative for chest pain, palpitations, orthopnea, leg swelling and PND.  Gastrointestinal: Negative for abdominal pain, blood in stool, bowel incontinence, constipation, diarrhea, dysphagia, heartburn, melena and nausea.  Genitourinary: Negative for bladder incontinence, dysuria, frequency, hematuria, impotence and urgency.  Musculoskeletal: Positive for back pain. Negative for joint pain, myalgias, muscle weakness and neck pain.  Skin: Negative for rash.  Neurological: Negative for dizziness, tingling, sensory change, focal weakness, numbness and headaches.  Endo/Heme/Allergies: Negative for environmental allergies and polydipsia. Does not bruise/bleed easily.  Psychiatric/Behavioral: Negative for confusion, decreased concentration, depression and suicidal ideas. The patient is nervous/anxious. The patient does not have insomnia.      Objective  Vitals:   09/25/16 0831  BP: 138/82  Pulse: 88  Weight: 201 lb (91.2 kg)  Height: 5\' 4"  (1.626 m)    Physical Exam  Constitutional: She is well-developed, well-nourished, and in no distress. No distress.  HENT:  Head: Normocephalic and atraumatic.  Right Ear: External ear normal.  Left Ear: External ear normal.  Nose: Nose normal.  Mouth/Throat: Oropharynx is clear and moist.  Eyes: Conjunctivae and EOM are normal. Pupils are equal, round, and reactive to light. Right eye exhibits no discharge. Left eye exhibits no discharge.  Neck: Normal range of motion. Neck supple. No JVD present. No thyromegaly present.  Cardiovascular: Normal rate, regular rhythm, normal heart sounds and intact distal pulses.  Exam reveals no gallop and no friction rub.   No murmur heard. Pulmonary/Chest:  Effort normal and breath sounds normal. She has no wheezes. She has no rales. She exhibits no tenderness.  Abdominal: Soft. Bowel sounds are normal. She exhibits no mass. There is no tenderness. There is no rebound and no guarding.  Musculoskeletal: Normal range of motion. She exhibits no edema.  Lymphadenopathy:    She has no cervical adenopathy.  Neurological: She is alert. She has normal reflexes.  Skin: Skin is warm and dry. She is not diaphoretic.  Psychiatric: Mood and affect normal.  Nursing note and vitals reviewed.     Assessment & Plan  Problem List Items Addressed This Visit      Cardiovascular and Mediastinum   Essential hypertension   Relevant Medications   lisinopril-hydrochlorothiazide (PRINZIDE,ZESTORETIC) 10-12.5 MG tablet   Other Relevant Orders   Renal Function Panel     Digestive   Gastroesophageal reflux disease   Relevant Medications   pantoprazole (PROTONIX) 40 MG tablet     Other   Acute anxiety   Relevant Medications   ALPRAZolam (XANAX) 0.25 MG tablet    Other Visit Diagnoses    Annual physical exam    -  Primary   Chronic low back pain, unspecified back pain laterality, with sciatica presence unspecified       Screening for HIV (human immunodeficiency virus)       Need for hepatitis C screening test       Episodic paroxysmal anxiety disorder       Relevant  Medications   ALPRAZolam (XANAX) 0.25 MG tablet   Class 1 obesity with body mass index (BMI) of 30.0 to 30.9 in adult, unspecified obesity type, unspecified whether serious comorbidity present       Relevant Orders   Lipid Profile   Colon cancer screening       Relevant Orders   POCT Occult Blood Stool (Completed)        Dr. Otilio Miu [facility]  09/25/16

## 2016-09-26 LAB — RENAL FUNCTION PANEL
Albumin: 4.7 g/dL (ref 3.5–5.5)
BUN/Creatinine Ratio: 16 (ref 9–23)
BUN: 14 mg/dL (ref 6–24)
CO2: 27 mmol/L (ref 18–29)
Calcium: 10.3 mg/dL — ABNORMAL HIGH (ref 8.7–10.2)
Chloride: 96 mmol/L (ref 96–106)
Creatinine, Ser: 0.9 mg/dL (ref 0.57–1.00)
GFR calc Af Amer: 84 mL/min/{1.73_m2} (ref >59)
GFR calc non Af Amer: 73 mL/min/{1.73_m2} (ref >59)
Glucose: 96 mg/dL (ref 65–99)
Phosphorus: 3.5 mg/dL (ref 2.5–4.5)
Potassium: 5.4 mmol/L — ABNORMAL HIGH (ref 3.5–5.2)
Sodium: 140 mmol/L (ref 134–144)

## 2016-09-26 LAB — LIPID PANEL
Chol/HDL Ratio: 2.7 ratio units (ref 0.0–4.4)
Cholesterol, Total: 205 mg/dL — ABNORMAL HIGH (ref 100–199)
HDL: 76 mg/dL (ref >39)
LDL Calculated: 115 mg/dL — ABNORMAL HIGH (ref 0–99)
Triglycerides: 69 mg/dL (ref 0–149)
VLDL Cholesterol Cal: 14 mg/dL (ref 5–40)

## 2016-12-16 ENCOUNTER — Other Ambulatory Visit: Payer: Self-pay | Admitting: Family Medicine

## 2016-12-16 DIAGNOSIS — K219 Gastro-esophageal reflux disease without esophagitis: Secondary | ICD-10-CM

## 2016-12-26 ENCOUNTER — Encounter: Payer: Self-pay | Admitting: *Deleted

## 2016-12-26 ENCOUNTER — Ambulatory Visit (INDEPENDENT_AMBULATORY_CARE_PROVIDER_SITE_OTHER): Payer: BLUE CROSS/BLUE SHIELD | Admitting: Family Medicine

## 2016-12-26 ENCOUNTER — Telehealth: Payer: Self-pay

## 2016-12-26 ENCOUNTER — Encounter: Payer: Self-pay | Admitting: Family Medicine

## 2016-12-26 VITALS — BP 90/68 | HR 88 | Ht 64.0 in | Wt 199.0 lb

## 2016-12-26 DIAGNOSIS — N63 Unspecified lump in unspecified breast: Secondary | ICD-10-CM | POA: Diagnosis not present

## 2016-12-26 NOTE — Progress Notes (Signed)
Name: Krista Harrison   MRN: [id number]    DOB: [birth year]   Date:12/26/2016       Progress Note  Subjective  Chief Complaint  Chief Complaint  Patient presents with  . Breast Problem    L) breast at 11:00- "had a little itch and felt a pebble when rubbed across area"- had a mammo on 09/25/16- normal    Patient noted breast "pebble" right upper inner quadrant.  Patient noted pruritis and felt small nontender mass.  No history of injury/nor previous surgery.  Positive hx mother. For breast and ovarian in mother but pt negative for [facility]     No problem-specific Assessment & Plan notes found for this encounter.   Past Medical History:  Diagnosis Date  . Anxiety   . GERD (gastroesophageal reflux disease)   . Hypertension     Past Surgical History:  Procedure Laterality Date  . ABDOMINAL HYSTERECTOMY    . ROTATOR CUFF REPAIR Bilateral     Family History  Problem Relation Age of Onset  . Cancer Mother   . Breast cancer Mother 73  . COPD Father     Social History   Social History  . Marital status: Married    Spouse name: N/A  . Number of children: N/A  . Years of education: N/A   Occupational History  . Not on file.   Social History Main Topics  . Smoking status: Never Smoker  . Smokeless tobacco: Never Used  . Alcohol use 0.0 oz/week     Comment: socially  . Drug use: No  . Sexual activity: Not on file   Other Topics Concern  . Not on file   Social History Narrative  . No narrative on file    No Known Allergies  Outpatient Medications Prior to Visit  Medication Sig Dispense Refill  . ALPRAZolam (XANAX) 0.25 MG tablet Take 1 tablet (0.25 mg total) by mouth daily as needed. 30 tablet 0  . lisinopril-hydrochlorothiazide (PRINZIDE,ZESTORETIC) 10-12.5 MG tablet Take 1 tablet by mouth daily. 90 tablet 1  . pantoprazole (PROTONIX) 40 MG tablet TAKE 1 TABLET (40 MG TOTAL) BY MOUTH DAILY. 30 tablet 1  . pantoprazole (PROTONIX) 40 MG tablet Take 1 tablet (40  mg total) by mouth daily. 30 tablet 1   No facility-administered medications prior to visit.     Review of Systems  Constitutional: Negative for chills, fever, malaise/fatigue and weight loss.  HENT: Negative for ear discharge, ear pain and sore throat.   Eyes: Negative for blurred vision.  Respiratory: Negative for cough, sputum production, shortness of breath and wheezing.   Cardiovascular: Negative for chest pain, palpitations and leg swelling.  Gastrointestinal: Negative for abdominal pain, blood in stool, constipation, diarrhea, heartburn, melena and nausea.  Genitourinary: Negative for dysuria, frequency, hematuria and urgency.  Musculoskeletal: Negative for back pain, joint pain, myalgias and neck pain.  Skin: Negative for rash.  Neurological: Negative for dizziness, tingling, sensory change, focal weakness and headaches.  Endo/Heme/Allergies: Negative for environmental allergies and polydipsia. Does not bruise/bleed easily.  Psychiatric/Behavioral: Negative for depression and suicidal ideas. The patient is not nervous/anxious and does not have insomnia.      Objective  Vitals:   12/26/16 1516  BP: 90/68  Pulse: 88  Weight: 199 lb (90.3 kg)  Height: 5\' 4"  (1.626 m)    Physical Exam  Constitutional: She is well-developed, well-nourished, and in no distress. No distress.  HENT:  Head: Normocephalic and atraumatic.  Right Ear: Tympanic membrane and  external ear normal.  Left Ear: Tympanic membrane and external ear normal.  Nose: Nose normal.  Mouth/Throat: Oropharynx is clear and moist.  Eyes: Conjunctivae and EOM are normal. Pupils are equal, round, and reactive to light. Right eye exhibits no discharge. Left eye exhibits no discharge.  Neck: Normal range of motion. Neck supple. No JVD present. No thyromegaly present.  Cardiovascular: Normal rate, regular rhythm, normal heart sounds and intact distal pulses.  Exam reveals no gallop and no friction rub.   No murmur  heard. Pulmonary/Chest: Effort normal and breath sounds normal. Right breast exhibits mass. Right breast exhibits no inverted nipple, no nipple discharge, no skin change and no tenderness. Left breast exhibits no inverted nipple, no mass, no nipple discharge, no skin change and no tenderness. Breasts are symmetrical.    Abdominal: Soft. Bowel sounds are normal. She exhibits no mass. There is no tenderness. There is no guarding.  Musculoskeletal: Normal range of motion. She exhibits no edema.  Lymphadenopathy:    She has no cervical adenopathy.  Neurological: She is alert.  Skin: Skin is warm and dry. She is not diaphoretic.  Psychiatric: Mood and affect normal.      Assessment & Plan  Problem List Items Addressed This Visit    None    Visit Diagnoses    Breast nodule    -  Primary   small 19mm/    Relevant Orders   Ambulatory referral to General Surgery      No orders of the defined types were placed in this encounter.     Dr. [facility]  12/26/16

## 2016-12-26 NOTE — Telephone Encounter (Signed)
Dr. Jake Seats nurse called wanting to schedule an appointment for the patient to be seen this week. She stated that the patient called in today since she had felt a knot on her left breast. The patient came in today to have it checked and was confirmed that she had a knot. I was also told that the patient had a normal mammogram in December. I asked if the patient had any discharge from her nipple, discoloration on her breast, fever or chills and I was told that she did not. I told her that I did not have anything available for this week but we would be able to see her on Monday. The nurse then told Dr. Ronnald Ramp if she wanted for the patient to wait till Monday and Dr. Ronnald Ramp disagreed. I apologized for the inconvenience. She then told me that they would call elsewhere.

## 2016-12-28 ENCOUNTER — Ambulatory Visit: Payer: Self-pay | Admitting: General Surgery

## 2017-01-02 ENCOUNTER — Other Ambulatory Visit: Payer: Self-pay | Admitting: Family Medicine

## 2017-01-02 DIAGNOSIS — K219 Gastro-esophageal reflux disease without esophagitis: Secondary | ICD-10-CM

## 2017-01-09 ENCOUNTER — Inpatient Hospital Stay: Payer: Self-pay

## 2017-01-09 ENCOUNTER — Encounter: Payer: Self-pay | Admitting: General Surgery

## 2017-01-09 ENCOUNTER — Ambulatory Visit (INDEPENDENT_AMBULATORY_CARE_PROVIDER_SITE_OTHER): Payer: BLUE CROSS/BLUE SHIELD | Admitting: General Surgery

## 2017-01-09 VITALS — BP 122/68 | HR 82 | Resp 12 | Ht 63.0 in | Wt 197.0 lb

## 2017-01-09 DIAGNOSIS — N6322 Unspecified lump in the left breast, upper inner quadrant: Secondary | ICD-10-CM

## 2017-01-09 DIAGNOSIS — Z803 Family history of malignant neoplasm of breast: Secondary | ICD-10-CM

## 2017-01-09 DIAGNOSIS — N6320 Unspecified lump in the left breast, unspecified quadrant: Secondary | ICD-10-CM

## 2017-01-09 NOTE — Patient Instructions (Addendum)
The patient is aware to call back for any questions or concerns.  Follow up ion 2 months with office visit ultrasound let breast

## 2017-01-09 NOTE — Progress Notes (Signed)
Patient ID: Krista Harrison, female   DOB: 10-Jul-1961, 56 y.o.   MRN: [id number]  Chief Complaint  Patient presents with  . Breast Problem    HPI Krista Harrison is a 56 y.o. female.  who presents for a breast evaluation. The most recent mammogram was done on 09-25-16.  Patient does perform regular self breast checks and gets regular mammograms done.   She states that her left breast was itching about 2-3 weeks ago and she noticed a small "pebble" knot.  HPI  Past Medical History:  Diagnosis Date  . Anxiety   . GERD (gastroesophageal reflux disease)   . Hypertension     Past Surgical History:  Procedure Laterality Date  . ABDOMINAL HYSTERECTOMY  2009  . ROTATOR CUFF REPAIR Bilateral 2005, 2006    Family History  Problem Relation Age of Onset  . Cancer Mother     ovarian  . Breast cancer Mother 59  . COPD Father     Social History Social History  Substance Use Topics  . Smoking status: Never Smoker  . Smokeless tobacco: Never Used  . Alcohol use 0.0 oz/week     Comment: socially    No Known Allergies  Current Outpatient Prescriptions  Medication Sig Dispense Refill  . ALPRAZolam (XANAX) 0.25 MG tablet Take 1 tablet (0.25 mg total) by mouth daily as needed. 30 tablet 0  . esomeprazole (NEXIUM) 20 MG capsule Take 20 mg by mouth daily at 12 noon.    [name] lisinopril-hydrochlorothiazide (PRINZIDE,ZESTORETIC) 10-12.5 MG tablet Take 1 tablet by mouth daily. 90 tablet 1  . pantoprazole (PROTONIX) 40 MG tablet TAKE 1 TABLET (40 MG TOTAL) BY MOUTH DAILY. (Patient taking differently: Take 40 mg by mouth as needed. ) 90 tablet 0   No current facility-administered medications for this visit.     Review of Systems Review of Systems  Constitutional: Negative.   Respiratory: Negative.   Cardiovascular: Negative.     Blood pressure 122/68, pulse 82, resp. rate 12, height 5\' 3"  (1.6 m), weight 197 lb (89.4 kg).  Physical Exam Physical Exam  Constitutional: She is  oriented to person, place, and time. She appears well-developed and well-nourished.  HENT:  Mouth/Throat: Oropharynx is clear and moist.  Eyes: Conjunctivae are normal. No scleral icterus.  Neck: Neck supple.  Cardiovascular: Normal rate, regular rhythm and normal heart sounds.   Pulmonary/Chest: Effort normal and breath sounds normal. Right breast exhibits no inverted nipple, no mass, no nipple discharge, no skin change and no tenderness. Left breast exhibits mass (5 mm nodule ). Left breast exhibits no inverted nipple, no nipple discharge, no skin change and no tenderness.    Abdominal: Soft. There is no tenderness.  Lymphadenopathy:    She has no cervical adenopathy.    She has no axillary adenopathy.  Neurological: She is alert and oriented to person, place, and time.  Skin: Skin is warm and dry.  Psychiatric: Her behavior is normal.    Data Reviewed Mammogram reviewed-no findings  Assessment    5 mm smooth nodule left breast. Left breast ultrasound-targeted over palpable mass shows no findings  The mass is likely benign and can be followed  Plan     Follow up in 2 months with office visit and ultrasound let breast     This information has been scribed by Gaspar Cola CMA.    SANKAR,SEEPLAPUTHUR G 01/11/2017, 8:31 AM

## 2017-02-05 ENCOUNTER — Encounter: Payer: Self-pay | Admitting: Family Medicine

## 2017-02-05 ENCOUNTER — Ambulatory Visit (INDEPENDENT_AMBULATORY_CARE_PROVIDER_SITE_OTHER): Payer: BLUE CROSS/BLUE SHIELD | Admitting: Family Medicine

## 2017-02-05 VITALS — BP 100/62 | HR 98 | Temp 97.8°F | Ht 63.0 in | Wt 198.0 lb

## 2017-02-05 DIAGNOSIS — L219 Seborrheic dermatitis, unspecified: Secondary | ICD-10-CM | POA: Diagnosis not present

## 2017-02-05 MED ORDER — PREDNISONE 10 MG PO TABS: 10.0000 mg | ORAL_TABLET | Freq: Every day | ORAL | 0 refills | Status: DC

## 2017-02-05 MED ORDER — HYDROCORTISONE 1 % EX CREA: 1.0000 "application " | TOPICAL_CREAM | Freq: Two times a day (BID) | CUTANEOUS | 0 refills | Status: DC

## 2017-02-05 MED ORDER — KETOCONAZOLE 1 % EX SHAM: 1.0000 "application " | MEDICATED_SHAMPOO | Freq: Every morning | CUTANEOUS | 0 refills | Status: DC

## 2017-02-05 NOTE — Patient Instructions (Signed)
Seborrheic Dermatitis, Adult Seborrheic dermatitis is a skin disease that causes red, scaly patches. It usually occurs on the scalp, and it is often called dandruff. The patches may appear on other parts of the body. Skin patches tend to appear where there are many oil glands in the skin. Areas of the body that are commonly affected include:  Scalp.  Skin folds of the body.  Ears.  Eyebrows.  Neck.  Face.  Armpits.  The bearded area of men's faces. The condition may come and go for no known reason, and it is often long-lasting (chronic). What are the causes? The cause of this condition is not known. What increases the risk? This condition is more likely to develop in people who:  Have certain conditions, such as:  HIV (human immunodeficiency virus).  AIDS (acquired immunodeficiency syndrome).  Parkinson disease.  Mood disorders, such as depression.  Are 27-16 years old. What are the signs or symptoms? Symptoms of this condition include:  Thick scales on the scalp.  Redness on the face or in the armpits.  Skin that is flaky. The flakes may be white or yellow.  Skin that seems oily or dry but is not helped with moisturizers.  Itching or burning in the affected areas. How is this diagnosed? This condition is diagnosed with a medical history and physical exam. A sample of your skin may be tested (skin biopsy). You may need to see a skin specialist (dermatologist). How is this treated? There is no cure for this condition, but treatment can help to manage the symptoms. You may get treatment to remove scales, lower the risk of skin infection, and reduce swelling or itching. Treatment may include:  Creams that reduce swelling and irritation (steroids).  Creams that reduce skin yeast.  Medicated shampoo, soaps, moisturizing creams, or ointments.  Medicated moisturizing creams or ointments. Follow these instructions at home:  Apply over-the-counter and prescription  medicines only as told by your health care provider.  Use any medicated shampoo, soaps, skin creams, or ointments only as told by your health care provider.  Keep all follow-up visits as told by your health care provider. This is important. Contact a health care provider if:  Your symptoms do not improve with treatment.  Your symptoms get worse.  You have new symptoms. This information is not intended to replace advice given to you by your health care provider. Make sure you discuss any questions you have with your health care provider. Document Released: 10/02/2005 Document Revised: 04/21/2016 Document Reviewed: 01/20/2016 Elsevier Interactive Patient Education  2017 [employer].

## 2017-02-05 NOTE — Progress Notes (Signed)
Name: Krista Harrison   MRN: [id number]    DOB: [birth year]   Date:02/05/2017       Progress Note  Subjective  Chief Complaint  Chief Complaint  Patient presents with  . Rash    started on chest/ base of neck x 1 month ago- used cortisone cream on it and it got better. Then started spreading up to chin and now on eyelids of both eyes. Causing swelling and burning at sights    Rash  This is a new problem. The current episode started 1 to 4 weeks ago. The problem has been waxing and waning since onset. The affected locations include the face, chest and neck. The rash is characterized by itchiness, redness and burning. Pertinent negatives include no cough, diarrhea, eye pain, facial edema, fever, joint pain, rhinorrhea, shortness of breath or sore throat. Past treatments include topical steroids. The treatment provided mild relief.    No problem-specific Assessment & Plan notes found for this encounter.   Past Medical History:  Diagnosis Date  . Anxiety   . GERD (gastroesophageal reflux disease)   . Hypertension     Past Surgical History:  Procedure Laterality Date  . ABDOMINAL HYSTERECTOMY  2009  . ROTATOR CUFF REPAIR Bilateral 2005, 2006    Family History  Problem Relation Age of Onset  . Cancer Mother     ovarian  . Breast cancer Mother 32  . COPD Father     Social History   Social History  . Marital status: Married    Spouse name: N/A  . Number of children: N/A  . Years of education: N/A   Occupational History  . Not on file.   Social History Main Topics  . Smoking status: Never Smoker  . Smokeless tobacco: Never Used  . Alcohol use 0.0 oz/week     Comment: socially  . Drug use: No  . Sexual activity: Not on file   Other Topics Concern  . Not on file   Social History Narrative  . No narrative on file    No Known Allergies  Outpatient Medications Prior to Visit  Medication Sig Dispense Refill  . ALPRAZolam (XANAX) 0.25 MG tablet Take 1 tablet  (0.25 mg total) by mouth daily as needed. 30 tablet 0  . esomeprazole (NEXIUM) 20 MG capsule Take 20 mg by mouth daily at 12 noon.    [name] lisinopril-hydrochlorothiazide (PRINZIDE,ZESTORETIC) 10-12.5 MG tablet Take 1 tablet by mouth daily. 90 tablet 1  . pantoprazole (PROTONIX) 40 MG tablet TAKE 1 TABLET (40 MG TOTAL) BY MOUTH DAILY. (Patient taking differently: Take 40 mg by mouth as needed. ) 90 tablet 0   No facility-administered medications prior to visit.     Review of Systems  Constitutional: Negative for chills, fever, malaise/fatigue and weight loss.  HENT: Negative for ear discharge, ear pain, rhinorrhea and sore throat.   Eyes: Negative for blurred vision and pain.  Respiratory: Negative for cough, sputum production, shortness of breath and wheezing.   Cardiovascular: Negative for chest pain, palpitations and leg swelling.  Gastrointestinal: Negative for abdominal pain, blood in stool, constipation, diarrhea, heartburn, melena and nausea.  Genitourinary: Negative for dysuria, frequency, hematuria and urgency.  Musculoskeletal: Negative for back pain, joint pain, myalgias and neck pain.  Skin: Positive for itching and rash.  Neurological: Negative for dizziness, tingling, sensory change, focal weakness and headaches.  Endo/Heme/Allergies: Negative for environmental allergies and polydipsia. Does not bruise/bleed easily.  Psychiatric/Behavioral: Negative for depression and suicidal ideas. The patient  is not nervous/anxious and does not have insomnia.      Objective  Vitals:   02/05/17 1116  BP: 100/62  Pulse: 98  Temp: 97.8 F (36.6 C)  TempSrc: Oral  Weight: 198 lb (89.8 kg)  Height: 5\' 3"  (1.6 m)    Physical Exam  Constitutional: She is well-developed, well-nourished, and in no distress. No distress.  HENT:  Head: Normocephalic and atraumatic.  Right Ear: External ear normal.  Left Ear: External ear normal.  Nose: Nose normal.  Mouth/Throat: Oropharynx is clear and  moist.  Eyes: Conjunctivae and EOM are normal. Pupils are equal, round, and reactive to light. Right eye exhibits no discharge. Left eye exhibits no discharge.  Neck: Normal range of motion. Neck supple. No JVD present. No thyromegaly present.  Cardiovascular: Normal rate, regular rhythm, normal heart sounds and intact distal pulses.  Exam reveals no gallop and no friction rub.   No murmur heard. Pulmonary/Chest: Effort normal and breath sounds normal.  Abdominal: Soft. Bowel sounds are normal. She exhibits no mass. There is no tenderness. There is no guarding.  Musculoskeletal: Normal range of motion. She exhibits no edema.  Lymphadenopathy:    She has no cervical adenopathy.  Neurological: She is alert.  Skin: Skin is warm, dry and intact. Rash noted. Rash is macular. She is not diaphoretic. There is erythema.  flakey/waxy  Psychiatric: Mood and affect normal.  Nursing note and vitals reviewed.     Assessment & Plan  Problem List Items Addressed This Visit    None    Visit Diagnoses    Seborrhea    -  Primary   Relevant Medications   predniSONE (DELTASONE) 10 MG tablet      Meds ordered this encounter  Medications  . hydrocortisone cream 1 %    Sig: Apply 1 application topically 2 (two) times daily.    Dispense:  30 g    Refill:  0  . KETOCONAZOLE, TOPICAL, (NIZORAL A-D) 1 % SHAM    Sig: Apply 1 application topically every morning.    Dispense:  1 Bottle    Refill:  0  . predniSONE (DELTASONE) 10 MG tablet    Sig: Take 1 tablet (10 mg total) by mouth daily with breakfast.    Dispense:  30 tablet    Refill:  0      Dr. Deanna Jones [facility]  02/05/17

## 2017-02-06 ENCOUNTER — Telehealth: Payer: Self-pay | Admitting: General Surgery

## 2017-02-06 NOTE — Telephone Encounter (Signed)
LEFT MESSAGE ON PATIENT'S CELL NUMBER TO CALL AND RESCHEDULE APPOINTMENT ON 03-06-17 @ 4:15 PM.PATIENT CAN MOVE TO AN EARLIER APPOINTMENT ON 03-06-17 OR A DIFFERENT DAY,DR SANKAR'S LAST PT WILL BE AT 3:00.

## 2017-02-12 ENCOUNTER — Encounter: Payer: Self-pay | Admitting: *Deleted

## 2017-02-21 ENCOUNTER — Encounter: Payer: Self-pay | Admitting: *Deleted

## 2017-03-06 ENCOUNTER — Ambulatory Visit: Payer: BLUE CROSS/BLUE SHIELD | Admitting: General Surgery

## 2017-03-12 ENCOUNTER — Other Ambulatory Visit: Payer: Self-pay | Admitting: Family Medicine

## 2017-03-12 DIAGNOSIS — I10 Essential (primary) hypertension: Secondary | ICD-10-CM

## 2017-03-18 ENCOUNTER — Other Ambulatory Visit: Payer: Self-pay | Admitting: Family Medicine

## 2017-03-18 DIAGNOSIS — L219 Seborrheic dermatitis, unspecified: Secondary | ICD-10-CM

## 2017-03-21 ENCOUNTER — Encounter: Payer: Self-pay | Admitting: Obstetrics and Gynecology

## 2017-03-21 ENCOUNTER — Ambulatory Visit (INDEPENDENT_AMBULATORY_CARE_PROVIDER_SITE_OTHER): Payer: BLUE CROSS/BLUE SHIELD | Admitting: Obstetrics and Gynecology

## 2017-03-21 VITALS — BP 128/80 | HR 105 | Ht 63.0 in | Wt 200.0 lb

## 2017-03-21 DIAGNOSIS — Z1371 Encounter for nonprocreative screening for genetic disease carrier status: Secondary | ICD-10-CM | POA: Diagnosis not present

## 2017-03-21 DIAGNOSIS — Z1231 Encounter for screening mammogram for malignant neoplasm of breast: Secondary | ICD-10-CM | POA: Diagnosis not present

## 2017-03-21 DIAGNOSIS — Z124 Encounter for screening for malignant neoplasm of cervix: Secondary | ICD-10-CM | POA: Diagnosis not present

## 2017-03-21 DIAGNOSIS — R102 Pelvic and perineal pain: Secondary | ICD-10-CM

## 2017-03-21 DIAGNOSIS — D6851 Activated protein C resistance: Secondary | ICD-10-CM | POA: Diagnosis not present

## 2017-03-21 DIAGNOSIS — Z01419 Encounter for gynecological examination (general) (routine) without abnormal findings: Secondary | ICD-10-CM | POA: Diagnosis not present

## 2017-03-21 DIAGNOSIS — Z1239 Encounter for other screening for malignant neoplasm of breast: Secondary | ICD-10-CM

## 2017-03-21 NOTE — Patient Instructions (Signed)
Preventive Care 40-64 Years, Female Preventive care refers to lifestyle choices and visits with your health care provider that can promote health and wellness. What does preventive care include?  A yearly physical exam. This is also called an annual well check.  Dental exams once or twice a year.  Routine eye exams. Ask your health care provider how often you should have your eyes checked.  Personal lifestyle choices, including: ? Daily care of your teeth and gums. ? Regular physical activity. ? Eating a healthy diet. ? Avoiding tobacco and drug use. ? Limiting alcohol use. ? Practicing safe sex. ? Taking low-dose aspirin daily starting at age 58. ? Taking vitamin and mineral supplements as recommended by your health care provider. What happens during an annual well check? The services and screenings done by your health care provider during your annual well check will depend on your age, overall health, lifestyle risk factors, and family history of disease. Counseling Your health care provider may ask you questions about your:  Alcohol use.  Tobacco use.  Drug use.  Emotional well-being.  Home and relationship well-being.  Sexual activity.  Eating habits.  Work and work Statistician.  Method of birth control.  Menstrual cycle.  Pregnancy history.  Screening You may have the following tests or measurements:  Height, weight, and BMI.  Blood pressure.  Lipid and cholesterol levels. These may be checked every 5 years, or more frequently if you are over 81 years old.  Skin check.  Lung cancer screening. You may have this screening every year starting at age 78 if you have a 30-pack-year history of smoking and currently smoke or have quit within the past 15 years.  Fecal occult blood test (FOBT) of the stool. You may have this test every year starting at age 65.  Flexible sigmoidoscopy or colonoscopy. You may have a sigmoidoscopy every 5 years or a colonoscopy  every 10 years starting at age 30.  Hepatitis C blood test.  Hepatitis B blood test.  Sexually transmitted disease (STD) testing.  Diabetes screening. This is done by checking your blood sugar (glucose) after you have not eaten for a while (fasting). You may have this done every 1-3 years.  Mammogram. This may be done every 1-2 years. Talk to your health care provider about when you should start having regular mammograms. This may depend on whether you have a family history of breast cancer.  BRCA-related cancer screening. This may be done if you have a family history of breast, ovarian, tubal, or peritoneal cancers.  Pelvic exam and Pap test. This may be done every 3 years starting at age 80. Starting at age 36, this may be done every 5 years if you have a Pap test in combination with an HPV test.  Bone density scan. This is done to screen for osteoporosis. You may have this scan if you are at high risk for osteoporosis.  Discuss your test results, treatment options, and if necessary, the need for more tests with your health care provider. Vaccines Your health care provider may recommend certain vaccines, such as:  Influenza vaccine. This is recommended every year.  Tetanus, diphtheria, and acellular pertussis (Tdap, Td) vaccine. You may need a Td booster every 10 years.  Varicella vaccine. You may need this if you have not been vaccinated.  Zoster vaccine. You may need this after age 5.  Measles, mumps, and rubella (MMR) vaccine. You may need at least one dose of MMR if you were born in  1957 or later. You may also need a second dose.  Pneumococcal 13-valent conjugate (PCV13) vaccine. You may need this if you have certain conditions and were not previously vaccinated.  Pneumococcal polysaccharide (PPSV23) vaccine. You may need one or two doses if you smoke cigarettes or if you have certain conditions.  Meningococcal vaccine. You may need this if you have certain  conditions.  Hepatitis A vaccine. You may need this if you have certain conditions or if you travel or work in places where you may be exposed to hepatitis A.  Hepatitis B vaccine. You may need this if you have certain conditions or if you travel or work in places where you may be exposed to hepatitis B.  Haemophilus influenzae type b (Hib) vaccine. You may need this if you have certain conditions.  Talk to your health care provider about which screenings and vaccines you need and how often you need them. This information is not intended to replace advice given to you by your health care provider. Make sure you discuss any questions you have with your health care provider. Document Released: 10/29/2015 Document Revised: 06/21/2016 Document Reviewed: 08/03/2015 Elsevier Interactive Patient Education  2017 [employer].

## 2017-03-21 NOTE — Progress Notes (Signed)
Patient ID: Krista Harrison, female   DOB: 02/12/1961, 56 y.o.   MRN: [id number]     Gynecology Annual Exam  PCP: Juline Patch, MD  Chief Complaint:  Chief Complaint  Patient presents with  . Gynecologic Exam    Pelvic pain on right side    History of Present Illness:Patient is a 56 y.o. G3P0020 presents for annual exam. The patient has no complaints today.   LMP: No LMP recorded. Patient has had a hysterectomy. Postmenopausal and s/p prior hysterectomy, no bleeding   The patient is sexually active. She denies dyspareunia.  The patient does perform self breast exams.  There is no notable family history of breast or ovarian cancer in her family.   The patient wears seatbelts: yes.   The patient has regular exercise: not asked.    The patient denies current symptoms of depression.     She is status post prior LSO at the time of her hysterectomy.  Mother with ovarian cancer late onset age of onset.  She has had prior BRCA testing per her report which was negative  Review of Systems: Review of Systems  Constitutional: Negative for chills and fever.  HENT: Negative for congestion.   Respiratory: Negative for cough and shortness of breath.   Cardiovascular: Negative for chest pain and palpitations.  Gastrointestinal: Negative for abdominal pain, constipation, diarrhea, heartburn, nausea and vomiting.  Genitourinary: Negative for dysuria, frequency and urgency.  Skin: Negative for itching and rash.  Neurological: Negative for dizziness and headaches.  Endo/Heme/Allergies: Negative for polydipsia.  Psychiatric/Behavioral: Negative for depression.    Past Medical History:  Past Medical History:  Diagnosis Date  . Anxiety   . GERD (gastroesophageal reflux disease)   . Hypertension     Past Surgical History:  Past Surgical History:  Procedure Laterality Date  . ABDOMINAL HYSTERECTOMY  2009  . ROTATOR CUFF REPAIR Bilateral 2005, 2006    Gynecologic History:  No LMP  recorded. Patient has had a hysterectomy. Last Pap: Results were: N/A prior hysterecotmy Last mammogram: 09/25/16 Results were: BI-RAD I Obstetric History: G3P0020  Family History:  Family History  Problem Relation Age of Onset  . Cancer Mother        ovarian  . Breast cancer Mother 3  . COPD Father     Social History:  Social History   Social History  . Marital status: Married    Spouse name: N/A  . Number of children: N/A  . Years of education: N/A   Occupational History  . Not on file.   Social History Main Topics  . Smoking status: Never Smoker  . Smokeless tobacco: Never Used  . Alcohol use 0.0 oz/week     Comment: socially  . Drug use: No  . Sexual activity: Yes   Other Topics Concern  . Not on file   Social History Narrative  . No narrative on file    Allergies:  No Known Allergies  Medications: Prior to Admission medications   Medication Sig Start Date End Date Taking? Authorizing Provider  ALPRAZolam (XANAX) 0.25 MG tablet Take 1 tablet (0.25 mg total) by mouth daily as needed. 09/25/16   Juline Patch, MD  esomeprazole (NEXIUM) 20 MG capsule Take 20 mg by mouth daily at 12 noon.    [provider]  hydrocortisone cream 1 % Apply 1 application topically 2 (two) times daily. 02/05/17   Juline Patch, MD  KETOCONAZOLE, TOPICAL, (NIZORAL A-D) 1 % SHAM Apply 1 application  topically every morning. 02/05/17   Juline Patch, MD  lisinopril-hydrochlorothiazide (PRINZIDE,ZESTORETIC) 10-12.5 MG tablet TAKE 1 TABLET BY MOUTH DAILY. 03/13/17   Juline Patch, MD  pantoprazole (PROTONIX) 40 MG tablet TAKE 1 TABLET (40 MG TOTAL) BY MOUTH DAILY. Patient taking differently: Take 40 mg by mouth as needed.  01/02/17   Juline Patch, MD  predniSONE (DELTASONE) 10 MG tablet Take 1 tablet (10 mg total) by mouth daily with breakfast. 02/05/17   Juline Patch, MD    Physical Exam Vitals: Blood pressure 128/80, pulse (!) 105, height '5\' 3"'  (1.6 m), weight 200  lb (90.7 kg).  General: NAD HEENT: normocephalic, anicteric Thyroid: no enlargement, no palpable nodules Pulmonary: No increased work of breathing, CTAB Cardiovascular: RRR, distal pulses 2+ Breast: Breast symmetrical, no tenderness, no palpable nodules or masses, no skin or nipple retraction present, no nipple discharge.  No axillary or supraclavicular lymphadenopathy. Abdomen: NABS, soft, non-tender, non-distended.  Umbilicus without lesions.  No hepatomegaly, splenomegaly or masses palpable. No evidence of hernia  Genitourinary:  External: Normal external female genitalia.  Normal urethral meatus, normal  Bartholin's and Skene's glands.    Vagina: Normal vaginal mucosa, no evidence of prolapse.    Cervix: normal  Uterus: surgically absent  Adnexa: ovaries non-enlarged, no adnexal masses  Rectal: deferred  Lymphatic: no evidence of inguinal lymphadenopathy Extremities: no edema, erythema, or tenderness Neurologic: Grossly intact Psychiatric: mood appropriate, affect full  Female chaperone present for pelvic and breast  portions of the physical exam    Assessment: 56 y.o. [id number] No problem-specific Assessment & Plan notes found for this encounter.   Plan: Problem List Items Addressed This Visit      Other   BRCA gene mutation negative    Other Visit Diagnoses    Screening for malignant neoplasm of cervix    -  Primary   Relevant Orders   PapIG, HPV, rfx 16/18   Breast screening       Relevant Orders   MM DIGITAL SCREENING BILATERAL   Encounter for gynecological examination without abnormal finding       Relevant Orders   MM DIGITAL SCREENING BILATERAL   PapIG, HPV, rfx 16/18   Acute pelvic pain, female       Relevant Orders   US Transvaginal Non-OB   Factor V Leiden ([place])          1) Mammogram - recommend yearly screening mammogram.  Mammogram Is up to date  2) STI screening was offered and declined  3) ASCCP guidelines and rational discussed.  Patient opts  for every 3 years screening interval s/p supracervical hysterectomy  4) Osteoporosis  - per USPTF routine screening DEXA at age 13 - FRAX 56 year major fracture risk 4.4%,  10 year hip fracture risk 0.2%  5) Routine healthcare maintenance including cholesterol, diabetes screening discussed managed by PCP  6) Colonoscopy was ordered last year did not have. Discussed cologard testing  7) Right lower quadrant pain last 2 months TVUS ordered, FH of ovarian cancer  8) Follow up 1 year for routine annual

## 2017-03-23 ENCOUNTER — Encounter: Payer: Self-pay | Admitting: Obstetrics and Gynecology

## 2017-03-23 LAB — PAPIG, HPV, RFX 16/18
HPV, high-risk: NEGATIVE
PAP Smear Comment: 0

## 2017-03-26 ENCOUNTER — Telehealth: Payer: Self-pay | Admitting: Obstetrics and Gynecology

## 2017-03-26 DIAGNOSIS — R05 Cough: Secondary | ICD-10-CM | POA: Diagnosis not present

## 2017-03-26 NOTE — Telephone Encounter (Signed)
Pt is calling wondering since her pap came back normal if she needs to keep her schedule appt and TVUS . Pt reports no pelvic pain at this time please Advise.

## 2017-03-26 NOTE — Telephone Encounter (Signed)
Called and Notified patient to keep her up coming ultra and follow up with AMS . Per AMS

## 2017-03-26 NOTE — Telephone Encounter (Signed)
Yes the pap being normal doesn't tell me anything about the abdominal discomfort she is having at time for that I still want the ultrasound

## 2017-04-03 ENCOUNTER — Other Ambulatory Visit: Payer: Self-pay | Admitting: Family Medicine

## 2017-04-03 DIAGNOSIS — K219 Gastro-esophageal reflux disease without esophagitis: Secondary | ICD-10-CM

## 2017-04-09 ENCOUNTER — Ambulatory Visit (INDEPENDENT_AMBULATORY_CARE_PROVIDER_SITE_OTHER): Payer: BLUE CROSS/BLUE SHIELD | Admitting: Obstetrics and Gynecology

## 2017-04-09 ENCOUNTER — Ambulatory Visit (INDEPENDENT_AMBULATORY_CARE_PROVIDER_SITE_OTHER): Payer: BLUE CROSS/BLUE SHIELD

## 2017-04-09 ENCOUNTER — Encounter: Payer: Self-pay | Admitting: Obstetrics and Gynecology

## 2017-04-09 VITALS — BP 118/70 | HR 74 | Wt 199.0 lb

## 2017-04-09 DIAGNOSIS — R102 Pelvic and perineal pain: Secondary | ICD-10-CM

## 2017-04-09 DIAGNOSIS — Z1211 Encounter for screening for malignant neoplasm of colon: Secondary | ICD-10-CM | POA: Diagnosis not present

## 2017-04-09 DIAGNOSIS — R1030 Lower abdominal pain, unspecified: Secondary | ICD-10-CM

## 2017-04-09 NOTE — Progress Notes (Signed)
Gynecology Ultrasound Follow Up  Chief Complaint:  Chief Complaint  Patient presents with  . GYN U/S follow up     History of Present Illness: Patient is a 56 y.o. female who presents today for ultrasound evaluation of pelvic pain .  Ultrasound demonstrates the following findgins Adnexa: no adnexal masses Uterus:  Surgically absent Additional: no free fluid  Pain resolved since last visit, no gyn pathology visualized on ultrasound today.  We discussed diverticular disease as well and reiterated importance of clonoscopy  Review of Systems: Review of Systems  Constitutional: Negative for chills and fever.  Gastrointestinal: Negative for abdominal pain, constipation and diarrhea.    Past Medical History:  Past Medical History:  Diagnosis Date  . Anxiety   . GERD (gastroesophageal reflux disease)   . Hypertension     Past Surgical History:  Past Surgical History:  Procedure Laterality Date  . ABDOMINAL HYSTERECTOMY  2009  . ROTATOR CUFF REPAIR Bilateral 2005, 2006    Gynecologic History:  No LMP recorded. Patient has had a hysterectomy. Contraception: postmenopausal Last Pap: 03/21/17. Results were: .NIL HPV negative  Family History:  Family History  Problem Relation Age of Onset  . Cancer Mother        ovarian  . Breast cancer Mother 48  . COPD Father     Social History:  Social History   Social History  . Marital status: Married    Spouse name: N/A  . Number of children: N/A  . Years of education: N/A   Occupational History  . Not on file.   Social History Main Topics  . Smoking status: Never Smoker  . Smokeless tobacco: Never Used  . Alcohol use 0.0 oz/week     Comment: socially  . Drug use: No  . Sexual activity: Yes   Other Topics Concern  . Not on file   Social History Narrative  . No narrative on file    Allergies:  No Known Allergies  Medications: Prior to Admission medications   Medication Sig Start Date End Date Taking?  Authorizing Provider  ALPRAZolam (XANAX) 0.25 MG tablet Take 1 tablet (0.25 mg total) by mouth daily as needed. 09/25/16   Juline Patch, MD  esomeprazole (NEXIUM) 20 MG capsule Take 20 mg by mouth daily at 12 noon.    [provider]  hydrocortisone cream 1 % Apply 1 application topically 2 (two) times daily. 02/05/17   Juline Patch, MD  KETOCONAZOLE, TOPICAL, (NIZORAL A-D) 1 % SHAM Apply 1 application topically every morning. 02/05/17   Juline Patch, MD  lisinopril-hydrochlorothiazide (PRINZIDE,ZESTORETIC) 10-12.5 MG tablet TAKE 1 TABLET BY MOUTH DAILY. 03/13/17   Juline Patch, MD  pantoprazole (PROTONIX) 40 MG tablet TAKE 1 TABLET (40 MG TOTAL) BY MOUTH DAILY. 04/03/17   Juline Patch, MD  predniSONE (DELTASONE) 10 MG tablet Take 1 tablet (10 mg total) by mouth daily with breakfast. 02/05/17   Juline Patch, MD    Physical Exam Vitals: Blood pressure 118/70, pulse 74, weight 199 lb (90.3 kg).  General: NAD HEENT: normocephalic, anicteric Pulmonary: No increased work of breathing Extremities: no edema, erythema, or tenderness Neurologic: Grossly intact, normal gait Psychiatric: mood appropriate, affect full   Assessment: 56 y.o. G3P0020 No problem-specific Assessment & Plan notes found for this encounter.   Plan: Problem List Items Addressed This Visit    None    Visit Diagnoses    Special screening for malignant neoplasms, colon    -  Primary   Relevant Orders   Ambulatory referral to Gastroenterology   Lower abdominal pain          1) Normal imaging symptoms improved 2) Colon cancer screening - Screening recommended starting at age 18 for average risk individuals, age 67 for individuals deemed at increased risk (including African Americans) and recommended to continue until age 33.  For patient age 20-85 individualized approach is recommended.  Gold standard screening is via colonoscopy, Cologuard screening is an acceptable alternative for patient unwilling  or unable to undergo colonoscopy.  "Colorectal cancer screening for average?risk adults: 2018 guideline update from the American Cancer Society"[place]: A Cancer Journal for Clinicians: Mar 14, 2017  - screening colonoscopy referral placed 3) A total of 15 minutes were spent in face-to-face contact with the patient during this encounter with over half of that time devoted to counseling and coordination of care.

## 2017-04-16 ENCOUNTER — Other Ambulatory Visit: Payer: Self-pay

## 2017-04-16 ENCOUNTER — Telehealth: Payer: Self-pay

## 2017-04-16 DIAGNOSIS — Z1211 Encounter for screening for malignant neoplasm of colon: Secondary | ICD-10-CM

## 2017-04-16 NOTE — Telephone Encounter (Signed)
Pt has been scheduled Colonoscopy for 07/23/17 at [facility] with Dr. Allen Norris.  Gastroenterology Pre-Procedure Review  Request Date: 07/23/17 Requesting Physician: Dr. Allen Norris  PATIENT REVIEW QUESTIONS: The patient responded to the following health history questions as indicated:    1. Are you having any GI issues? yes (Right Side Pain) 2. Do you have a personal history of Polyps? no 3. Do you have a family history of Colon Cancer or Polyps? no 4. Diabetes Mellitus? no 5. Joint replacements in the past 12 months?no 6. Major health problems in the past 3 months?no 7. Any artificial heart valves, MVP, or defibrillator?no    MEDICATIONS & ALLERGIES:    Patient reports the following regarding taking any anticoagulation/antiplatelet therapy:   Plavix, Coumadin, Eliquis, Xarelto, Lovenox, Pradaxa, Brilinta, or Effient? no Aspirin? no  Patient confirms/reports the following medications:  Current Outpatient Prescriptions  Medication Sig Dispense Refill  . ALPRAZolam (XANAX) 0.25 MG tablet Take 1 tablet (0.25 mg total) by mouth daily as needed. 30 tablet 0  . esomeprazole (NEXIUM) 20 MG capsule Take 20 mg by mouth daily at 12 noon.    . hydrocortisone cream 1 % Apply 1 application topically 2 (two) times daily. 30 g 0  . KETOCONAZOLE, TOPICAL, (NIZORAL A-D) 1 % SHAM Apply 1 application topically every morning. 1 Bottle 0  . lisinopril-hydrochlorothiazide (PRINZIDE,ZESTORETIC) 10-12.5 MG tablet TAKE 1 TABLET BY MOUTH DAILY. 90 tablet 0  . pantoprazole (PROTONIX) 40 MG tablet TAKE 1 TABLET (40 MG TOTAL) BY MOUTH DAILY. 90 tablet 0  . predniSONE (DELTASONE) 10 MG tablet Take 1 tablet (10 mg total) by mouth daily with breakfast. 30 tablet 0   No current facility-administered medications for this visit.     Patient confirms/reports the following allergies:  No Known Allergies  No orders of the defined types were placed in this encounter.   AUTHORIZATION INFORMATION Primary Insurance: 1D#: Group  #:  Secondary Insurance: 1D#: Group #:  SCHEDULE INFORMATION: Date: 07/23/17 Time: Location:[facility]

## 2017-06-12 ENCOUNTER — Other Ambulatory Visit: Payer: Self-pay | Admitting: Family Medicine

## 2017-06-12 DIAGNOSIS — I10 Essential (primary) hypertension: Secondary | ICD-10-CM

## 2017-07-16 ENCOUNTER — Other Ambulatory Visit: Payer: Self-pay | Admitting: Family Medicine

## 2017-07-16 DIAGNOSIS — I10 Essential (primary) hypertension: Secondary | ICD-10-CM

## 2017-08-09 ENCOUNTER — Other Ambulatory Visit: Payer: Self-pay

## 2017-08-13 ENCOUNTER — Encounter: Payer: Self-pay | Admitting: Anesthesiology

## 2017-08-14 ENCOUNTER — Ambulatory Visit: Payer: BLUE CROSS/BLUE SHIELD | Admitting: General Surgery

## 2017-08-27 ENCOUNTER — Ambulatory Visit
Admission: RE | Admit: 2017-08-27 | Payer: BLUE CROSS/BLUE SHIELD | Source: Ambulatory Visit | Admitting: Gastroenterology

## 2017-08-27 HISTORY — DX: Activated protein C resistance: D68.51

## 2017-08-27 SURGERY — COLONOSCOPY WITH PROPOFOL
Anesthesia: Choice

## 2017-08-30 ENCOUNTER — Encounter: Payer: Self-pay | Admitting: *Deleted

## 2017-10-01 ENCOUNTER — Telehealth: Payer: Self-pay | Admitting: General Surgery

## 2017-10-01 NOTE — Telephone Encounter (Signed)
I SPOKE TO PATIENT ABOUT RESCHEDULING HER APPOINTMENT WITH DR Jamal Collin FROM 08-14-17.SHE DOES NOT WANT TO RESCHEDULE.SHE STATES HE MADE HER FEEL COMFORTABLE WITH THINGS AT HER LAST VISIT.

## 2017-10-30 ENCOUNTER — Other Ambulatory Visit: Payer: Self-pay

## 2017-10-30 DIAGNOSIS — I10 Essential (primary) hypertension: Secondary | ICD-10-CM

## 2017-10-30 MED ORDER — LISINOPRIL-HYDROCHLOROTHIAZIDE 10-12.5 MG PO TABS: 1.0000 | ORAL_TABLET | Freq: Every day | ORAL | 0 refills | Status: DC

## 2017-12-27 DIAGNOSIS — N390 Urinary tract infection, site not specified: Secondary | ICD-10-CM | POA: Diagnosis not present

## 2018-01-21 DIAGNOSIS — N390 Urinary tract infection, site not specified: Secondary | ICD-10-CM | POA: Diagnosis not present

## 2018-03-14 ENCOUNTER — Ambulatory Visit: Payer: BLUE CROSS/BLUE SHIELD | Admitting: Family Medicine

## 2018-03-15 ENCOUNTER — Ambulatory Visit: Payer: Self-pay | Admitting: Family Medicine

## 2018-03-21 ENCOUNTER — Ambulatory Visit: Payer: Self-pay | Admitting: Family Medicine

## 2018-03-27 ENCOUNTER — Ambulatory Visit: Payer: Self-pay | Admitting: Family Medicine

## 2018-04-04 ENCOUNTER — Encounter: Payer: Self-pay | Admitting: Family Medicine

## 2018-04-04 ENCOUNTER — Ambulatory Visit (INDEPENDENT_AMBULATORY_CARE_PROVIDER_SITE_OTHER): Payer: BLUE CROSS/BLUE SHIELD | Admitting: Family Medicine

## 2018-04-04 VITALS — BP 142/100 | HR 72 | Ht 63.0 in | Wt 227.0 lb

## 2018-04-04 DIAGNOSIS — E7800 Pure hypercholesterolemia, unspecified: Secondary | ICD-10-CM

## 2018-04-04 DIAGNOSIS — K219 Gastro-esophageal reflux disease without esophagitis: Secondary | ICD-10-CM

## 2018-04-04 DIAGNOSIS — I10 Essential (primary) hypertension: Secondary | ICD-10-CM | POA: Diagnosis not present

## 2018-04-04 DIAGNOSIS — F419 Anxiety disorder, unspecified: Secondary | ICD-10-CM | POA: Diagnosis not present

## 2018-04-04 DIAGNOSIS — Z1211 Encounter for screening for malignant neoplasm of colon: Secondary | ICD-10-CM

## 2018-04-04 DIAGNOSIS — R21 Rash and other nonspecific skin eruption: Secondary | ICD-10-CM

## 2018-04-04 LAB — FECAL OCCULT BLOOD, GUAIAC: Fecal Occult Blood: NEGATIVE

## 2018-04-04 MED ORDER — CLOTRIMAZOLE 1 % EX CREA: 1.0000 "application " | TOPICAL_CREAM | Freq: Two times a day (BID) | CUTANEOUS | 0 refills | Status: DC

## 2018-04-04 MED ORDER — PANTOPRAZOLE SODIUM 40 MG PO TBEC: 40.0000 mg | DELAYED_RELEASE_TABLET | Freq: Every day | ORAL | 3 refills | Status: DC

## 2018-04-04 MED ORDER — ALPRAZOLAM 0.25 MG PO TABS: 0.2500 mg | ORAL_TABLET | Freq: Every day | ORAL | 0 refills | Status: DC | PRN

## 2018-04-04 MED ORDER — LISINOPRIL-HYDROCHLOROTHIAZIDE 10-12.5 MG PO TABS: 1.0000 | ORAL_TABLET | Freq: Every day | ORAL | 1 refills | Status: DC

## 2018-04-04 NOTE — Assessment & Plan Note (Addendum)
For the most part controlled. Alprazolam 0.25 mg prn panic disorder.

## 2018-04-04 NOTE — Assessment & Plan Note (Signed)
Refill clotrimazole cream for rash under breasts

## 2018-04-04 NOTE — Assessment & Plan Note (Addendum)
Pt off meds- start med again and recheck b/p on meds in 6 months. Resume lisinopril/HCTZ 10/12.5 and check renal panel

## 2018-04-04 NOTE — Assessment & Plan Note (Signed)
Fair control on omeprazole otc ,will change to pantoprazole 40 mg daily.

## 2018-04-04 NOTE — Assessment & Plan Note (Signed)
Diet cotrolled,will checl lipid panel

## 2018-04-04 NOTE — Progress Notes (Signed)
Name: Krista Harrison   MRN: [id number]    DOB: 1960-11-06   Date:04/04/2018       Progress Note  Subjective  Chief Complaint  Chief Complaint  Patient presents with  . Gastroesophageal Reflux  . Hypertension  . Anxiety  . Rash    refill nystatin for yeast under breast    Gastroesophageal Reflux  She complains of heartburn. She reports no abdominal pain, no belching, no chest pain, no choking, no coughing, no dysphagia, no early satiety, no hoarse voice, no nausea, no sore throat, no water brash or no wheezing. This is a recurrent problem. The current episode started more than 1 year ago. The problem occurs occasionally. The problem has been waxing and waning. The heartburn is located in the substernum. The heartburn is of mild intensity. The heartburn wakes her from sleep. The symptoms are aggravated by certain foods. Pertinent negatives include no anemia, fatigue, melena, muscle weakness, orthopnea or weight loss. She has tried nothing for the symptoms. The treatment provided moderate relief. Past procedures do not include an abdominal ultrasound, an EGD, esophageal manometry, esophageal pH monitoring, H. pylori antibody titer or a UGI.  Hypertension  This is a chronic problem. The current episode started more than 1 year ago. The problem has been waxing and waning since onset. The problem is controlled. Pertinent negatives include no anxiety, blurred vision, chest pain, headaches, malaise/fatigue, neck pain, orthopnea, palpitations, peripheral edema, PND, shortness of breath or sweats. There are no associated agents to hypertension. There are no known risk factors for coronary artery disease. Past treatments include ACE inhibitors and diuretics. The current treatment provides mild improvement. There are no compliance problems.  There is no history of angina, kidney disease, CAD/MI, CVA, heart failure, left ventricular hypertrophy, PVD or retinopathy. There is no history of chronic renal  disease, a hypertension causing med or renovascular disease.  Anxiety  Presents for follow-up visit. Symptoms include excessive worry, nervous/anxious behavior and panic. Patient reports no chest pain, compulsions, depressed mood, dizziness, hyperventilation, insomnia, nausea, palpitations, restlessness, shortness of breath or suicidal ideas. The severity of symptoms is mild. The quality of sleep is non-restorative.      Essential hypertension Pt off meds- start med again and recheck b/p on meds in 6 months. Resume lisinopril/HCTZ 10/12.5 and check renal panel  Acute anxiety For the most part controlled. Alprazolam 0.25 mg prn panic disorder.  Gastroesophageal reflux disease Fair control on omeprazole otc ,will change to pantoprazole 40 mg daily.  Pure hypercholesterolemia Diet cotrolled,will checl lipid panel  Rash, skin Refill clotrimazole cream for rash under breasts   Past Medical History:  Diagnosis Date  . Anxiety   . Factor V Leiden ([place])    heterozygous  . GERD (gastroesophageal reflux disease)   . Hypertension     Past Surgical History:  Procedure Laterality Date  . ABDOMINAL HYSTERECTOMY  2009  . ROTATOR CUFF REPAIR Bilateral 2005, 2006    Family History  Problem Relation Age of Onset  . Cancer Mother        ovarian  . Breast cancer Mother 40  . COPD Father     Social History   Socioeconomic History  . Marital status: Married    Spouse name: Not on file  . Number of children: Not on file  . Years of education: Not on file  . Highest education level: Not on file  Occupational History  . Not on file  Social Needs  . Financial resource strain: Not on  file  . Food insecurity:    Worry: Not on file    Inability: Not on file  . Transportation needs:    Medical: Not on file    Non-medical: Not on file  Tobacco Use  . Smoking status: Never Smoker  . Smokeless tobacco: Never Used  Substance and Sexual Activity  . Alcohol use: Yes    Alcohol/week:  0.0 oz    Comment: socially  . Drug use: No  . Sexual activity: Yes  Lifestyle  . Physical activity:    Days per week: Not on file    Minutes per session: Not on file  . Stress: Not on file  Relationships  . Social connections:    Talks on phone: Not on file    Gets together: Not on file    Attends religious service: Not on file    Active member of club or organization: Not on file    Attends meetings of clubs or organizations: Not on file    Relationship status: Not on file  . Intimate partner violence:    Fear of current or ex partner: Not on file    Emotionally abused: Not on file    Physically abused: Not on file    Forced sexual activity: Not on file  Other Topics Concern  . Not on file  Social History Narrative  . Not on file    No Known Allergies  Outpatient Medications Prior to Visit  Medication Sig Dispense Refill  . esomeprazole (NEXIUM) 20 MG capsule Take 20 mg by mouth daily at 12 noon.    . hydrocortisone cream 1 % Apply 1 application topically 2 (two) times daily. 30 g 0  . ALPRAZolam (XANAX) 0.25 MG tablet Take 1 tablet (0.25 mg total) by mouth daily as needed. 30 tablet 0  . lisinopril-hydrochlorothiazide (PRINZIDE,ZESTORETIC) 10-12.5 MG tablet Take 1 tablet by mouth daily. 30 tablet 0  . magnesium 30 MG tablet Take 30 mg by mouth 2 (two) times daily.    [name] KETOCONAZOLE, TOPICAL, (NIZORAL A-D) 1 % SHAM Apply 1 application topically every morning. 1 Bottle 0  . pantoprazole (PROTONIX) 40 MG tablet TAKE 1 TABLET (40 MG TOTAL) BY MOUTH DAILY. (Patient not taking: Reported on 08/09/2017) 90 tablet 0  . predniSONE (DELTASONE) 10 MG tablet Take 1 tablet (10 mg total) by mouth daily with breakfast. (Patient not taking: Reported on 08/09/2017) 30 tablet 0   No facility-administered medications prior to visit.     Review of Systems  Constitutional: Negative for chills, fatigue, fever, malaise/fatigue and weight loss.  HENT: Negative for ear discharge, ear pain,  hoarse voice and sore throat.   Eyes: Negative for blurred vision.  Respiratory: Negative for cough, sputum production, choking, shortness of breath and wheezing.   Cardiovascular: Negative for chest pain, palpitations, orthopnea, leg swelling and PND.  Gastrointestinal: Positive for heartburn. Negative for abdominal pain, blood in stool, constipation, diarrhea, dysphagia, melena and nausea.  Genitourinary: Negative for dysuria, frequency, hematuria and urgency.  Musculoskeletal: Negative for back pain, joint pain, myalgias, muscle weakness and neck pain.  Skin: Negative for rash.  Neurological: Negative for dizziness, tingling, sensory change, focal weakness and headaches.  Endo/Heme/Allergies: Negative for environmental allergies and polydipsia. Does not bruise/bleed easily.  Psychiatric/Behavioral: Negative for depression and suicidal ideas. The patient is nervous/anxious. The patient does not have insomnia.      Objective  Vitals:   04/04/18 0818  BP: (!) 142/100  Pulse: 72  Weight: 227 lb (103 kg)  Height: 5\' 3"  (1.6 m)    Physical Exam  Constitutional: She is oriented to person, place, and time. She appears well-developed and well-nourished.  HENT:  Head: Normocephalic.  Right Ear: External ear normal.  Left Ear: External ear normal.  Mouth/Throat: Oropharynx is clear and moist.  Eyes: Pupils are equal, round, and reactive to light. Conjunctivae and EOM are normal. Lids are everted and swept, no foreign bodies found. Left eye exhibits no hordeolum. No foreign body present in the left eye. Right conjunctiva is not injected. Left conjunctiva is not injected. No scleral icterus.  Neck: Normal range of motion. Neck supple. No JVD present. No tracheal deviation present. No thyromegaly present.  Cardiovascular: Normal rate, regular rhythm, S1 normal, S2 normal, normal heart sounds and intact distal pulses. Exam reveals no gallop, no S3, no S4, no distant heart sounds and no friction  rub.  No murmur heard.  No systolic murmur is present.  No diastolic murmur is present. Pulmonary/Chest: Effort normal and breath sounds normal. No respiratory distress. She has no wheezes. She has no rales.  Abdominal: Soft. Bowel sounds are normal. She exhibits no mass. There is no hepatosplenomegaly. There is no tenderness. There is no rebound and no guarding.  Musculoskeletal: Normal range of motion. She exhibits no edema or tenderness.  Lymphadenopathy:    She has no cervical adenopathy.  Neurological: She is alert and oriented to person, place, and time. She has normal strength. She displays normal reflexes. No cranial nerve deficit.  Skin: Skin is warm. No rash noted.  Psychiatric: She has a normal mood and affect. Her mood appears not anxious. She does not exhibit a depressed mood.  Nursing note and vitals reviewed.     Assessment & Plan  Problem List Items Addressed This Visit      Cardiovascular and Mediastinum   Essential hypertension - Primary    Pt off meds- start med again and recheck b/p on meds in 6 months. Resume lisinopril/HCTZ 10/12.5 and check renal panel      Relevant Medications   lisinopril-hydrochlorothiazide (PRINZIDE,ZESTORETIC) 10-12.5 MG tablet   Other Relevant Orders   Renal Function Panel     Digestive   Gastroesophageal reflux disease    Fair control on omeprazole otc ,will change to pantoprazole 40 mg daily.      Relevant Medications   pantoprazole (PROTONIX) 40 MG tablet     Musculoskeletal and Integument   Rash, skin    Refill clotrimazole cream for rash under breasts      Relevant Medications   clotrimazole (LOTRIMIN) 1 % cream     Other   Acute anxiety    For the most part controlled. Alprazolam 0.25 mg prn panic disorder.      Relevant Medications   ALPRAZolam (XANAX) 0.25 MG tablet   Pure hypercholesterolemia    Diet cotrolled,will checl lipid panel      Relevant Medications   lisinopril-hydrochlorothiazide  (PRINZIDE,ZESTORETIC) 10-12.5 MG tablet   Other Relevant Orders   Lipid panel    Other Visit Diagnoses    Colon cancer screening       Relevant Orders   Fecal occult blood, imunochemical([facility])      Meds ordered this encounter  Medications  . lisinopril-hydrochlorothiazide (PRINZIDE,ZESTORETIC) 10-12.5 MG tablet    Sig: Take 1 tablet by mouth daily.    Dispense:  90 tablet    Refill:  1    sched appt  . ALPRAZolam (XANAX) 0.25 MG tablet    Sig: Take 1  tablet (0.25 mg total) by mouth daily as needed.    Dispense:  30 tablet    Refill:  0    This request is for a new prescription for a controlled substance as required by Federal/State law..  . pantoprazole (PROTONIX) 40 MG tablet    Sig: Take 1 tablet (40 mg total) by mouth daily.    Dispense:  90 tablet    Refill:  3  . clotrimazole (LOTRIMIN) 1 % cream    Sig: Apply 1 application topically 2 (two) times daily.    Dispense:  30 g    Refill:  0      Dr. Macon [facility]  04/04/18

## 2018-04-05 LAB — RENAL FUNCTION PANEL
Albumin: 4.3 g/dL (ref 3.5–5.5)
BUN/Creatinine Ratio: 28 — ABNORMAL HIGH (ref 9–23)
BUN: 19 mg/dL (ref 6–24)
CO2: 26 mmol/L (ref 20–29)
Calcium: 9.3 mg/dL (ref 8.7–10.2)
Chloride: 102 mmol/L (ref 96–106)
Creatinine, Ser: 0.69 mg/dL (ref 0.57–1.00)
GFR calc Af Amer: 113 mL/min/{1.73_m2} (ref >59)
GFR calc non Af Amer: 98 mL/min/{1.73_m2} (ref >59)
Glucose: 97 mg/dL (ref 65–99)
Phosphorus: 3.3 mg/dL (ref 2.5–4.5)
Potassium: 4.2 mmol/L (ref 3.5–5.2)
Sodium: 141 mmol/L (ref 134–144)

## 2018-04-05 LAB — LIPID PANEL
Chol/HDL Ratio: 2.9 ratio (ref 0.0–4.4)
Cholesterol, Total: 194 mg/dL (ref 100–199)
HDL: 66 mg/dL (ref >39)
LDL Calculated: 108 mg/dL — ABNORMAL HIGH (ref 0–99)
Triglycerides: 99 mg/dL (ref 0–149)
VLDL Cholesterol Cal: 20 mg/dL (ref 5–40)

## 2018-04-05 LAB — FECAL OCCULT BLOOD, GUAIAC: Fecal Occult Blood: NEGATIVE

## 2018-04-23 DIAGNOSIS — Z1211 Encounter for screening for malignant neoplasm of colon: Secondary | ICD-10-CM | POA: Diagnosis not present

## 2018-04-28 LAB — FECAL OCCULT BLOOD, IMMUNOCHEMICAL: Fecal Occult Bld: NEGATIVE

## 2018-05-24 DIAGNOSIS — M545 Low back pain: Secondary | ICD-10-CM | POA: Diagnosis not present

## 2018-08-05 DIAGNOSIS — J209 Acute bronchitis, unspecified: Secondary | ICD-10-CM | POA: Diagnosis not present

## 2018-08-06 DIAGNOSIS — J40 Bronchitis, not specified as acute or chronic: Secondary | ICD-10-CM | POA: Diagnosis not present

## 2018-08-06 DIAGNOSIS — I10 Essential (primary) hypertension: Secondary | ICD-10-CM | POA: Diagnosis not present

## 2018-08-06 DIAGNOSIS — R05 Cough: Secondary | ICD-10-CM | POA: Diagnosis not present

## 2018-08-06 DIAGNOSIS — R0989 Other specified symptoms and signs involving the circulatory and respiratory systems: Secondary | ICD-10-CM | POA: Diagnosis not present

## 2018-09-04 ENCOUNTER — Ambulatory Visit: Payer: BLUE CROSS/BLUE SHIELD | Admitting: Family Medicine

## 2018-09-04 ENCOUNTER — Encounter: Payer: Self-pay | Admitting: Family Medicine

## 2018-09-04 VITALS — BP 124/80 | HR 80 | Ht 63.0 in | Wt 220.0 lb

## 2018-09-04 DIAGNOSIS — I10 Essential (primary) hypertension: Secondary | ICD-10-CM | POA: Diagnosis not present

## 2018-09-04 DIAGNOSIS — F320 Major depressive disorder, single episode, mild: Secondary | ICD-10-CM

## 2018-09-04 DIAGNOSIS — F419 Anxiety disorder, unspecified: Secondary | ICD-10-CM | POA: Diagnosis not present

## 2018-09-04 DIAGNOSIS — K219 Gastro-esophageal reflux disease without esophagitis: Secondary | ICD-10-CM

## 2018-09-04 DIAGNOSIS — D229 Melanocytic nevi, unspecified: Secondary | ICD-10-CM

## 2018-09-04 DIAGNOSIS — R5383 Other fatigue: Secondary | ICD-10-CM | POA: Diagnosis not present

## 2018-09-04 MED ORDER — LISINOPRIL-HYDROCHLOROTHIAZIDE 10-12.5 MG PO TABS: 1.0000 | ORAL_TABLET | Freq: Every day | ORAL | 1 refills | Status: DC

## 2018-09-04 MED ORDER — SERTRALINE HCL 50 MG PO TABS: 50.0000 mg | ORAL_TABLET | Freq: Every day | ORAL | 3 refills | Status: DC

## 2018-09-04 MED ORDER — ALPRAZOLAM 0.25 MG PO TABS: 0.2500 mg | ORAL_TABLET | Freq: Every day | ORAL | 0 refills | Status: DC | PRN

## 2018-09-04 MED ORDER — PANTOPRAZOLE SODIUM 40 MG PO TBEC: 40.0000 mg | DELAYED_RELEASE_TABLET | Freq: Every day | ORAL | 3 refills | Status: DC

## 2018-09-04 NOTE — Progress Notes (Signed)
Date:  09/04/2018   Name:  Krista Harrison   DOB:  1961-08-21   MRN:  [id number]   Chief Complaint: Hypertension; Gastroesophageal Reflux; and Anxiety Hypertension  This is a chronic problem. The current episode started more than 1 year ago. The problem has been waxing and waning since onset. The problem is controlled. Associated symptoms include anxiety. Pertinent negatives include no blurred vision, chest pain, headaches, malaise/fatigue, neck pain, orthopnea, palpitations, peripheral edema, PND, shortness of breath or sweats. There are no associated agents to hypertension. There are no known risk factors for coronary artery disease. Past treatments include ACE inhibitors and diuretics. The current treatment provides moderate improvement. There are no compliance problems.  There is no history of angina, kidney disease, CAD/MI, CVA, heart failure, left ventricular hypertrophy, PVD or retinopathy. There is no history of chronic renal disease, a hypertension causing med or renovascular disease.  Gastroesophageal Reflux  She reports no abdominal pain, no belching, no chest pain, no choking, no coughing, no dysphagia, no early satiety, no globus sensation, no heartburn, no nausea, no sore throat, no stridor or no wheezing. This is a new problem. The current episode started more than 1 year ago. The problem occurs occasionally. The problem has been gradually improving. The symptoms are aggravated by certain foods. Pertinent negatives include no anemia, fatigue, melena, muscle weakness, orthopnea or weight loss. She has tried a PPI for the symptoms. The treatment provided moderate relief. Past procedures include an EGD.  Anxiety  Presents for follow-up visit. Symptoms include decreased concentration, excessive worry, nervous/anxious behavior, panic and restlessness. Patient reports no chest pain, compulsions, confusion, depressed mood, dizziness, dry mouth, feeling of choking, hyperventilation,  impotence, insomnia, irritability, malaise, muscle tension, nausea, obsessions, palpitations, shortness of breath or suicidal ideas. Symptoms occur occasionally. The severity of symptoms is mild.    Depression         Associated symptoms include decreased concentration, hopelessness, irritable, restlessness and sad.  Associated symptoms include no fatigue, no helplessness, does not have insomnia, no decreased interest, no appetite change, no body aches, no myalgias, no headaches, no indigestion and no suicidal ideas.  Past treatments include nothing.  Previous treatment provided mild relief.  Past medical history includes anxiety.      Review of Systems  Constitutional: Positive for unexpected weight change. Negative for appetite change, chills, fatigue, fever, irritability, malaise/fatigue and weight loss.  HENT: Negative for drooling, ear discharge, ear pain and sore throat.   Eyes: Negative for blurred vision.  Respiratory: Negative for cough, choking, shortness of breath and wheezing.   Cardiovascular: Negative for chest pain, palpitations, orthopnea, leg swelling and PND.  Gastrointestinal: Negative for abdominal pain, blood in stool, constipation, diarrhea, dysphagia, heartburn, melena and nausea.  Endocrine: Negative for polydipsia.  Genitourinary: Negative for dysuria, frequency, hematuria, impotence, urgency, vaginal bleeding, vaginal discharge and vaginal pain.  Musculoskeletal: Negative for back pain, myalgias, muscle weakness and neck pain.  Skin: Negative for rash.       Mole/facial spot  Allergic/Immunologic: Negative for environmental allergies.  Neurological: Negative for dizziness and headaches.  Hematological: Does not bruise/bleed easily.  Psychiatric/Behavioral: Positive for agitation, decreased concentration and depression. Negative for confusion and suicidal ideas. The patient is nervous/anxious. The patient does not have insomnia.     Patient Active Problem List    Diagnosis Date Noted  . Pure hypercholesterolemia 04/04/2018  . Rash, skin 04/04/2018  . BRCA gene mutation negative 03/21/2017  . Essential hypertension 09/25/2016  . Gastroesophageal reflux  disease 09/25/2016  . Acute anxiety 09/25/2016    No Known Allergies  Past Surgical History:  Procedure Laterality Date  . ABDOMINAL HYSTERECTOMY  2009  . ROTATOR CUFF REPAIR Bilateral 2005, 2006    Social History   Tobacco Use  . Smoking status: Never Smoker  . Smokeless tobacco: Never Used  Substance Use Topics  . Alcohol use: Yes    Alcohol/week: 0.0 standard drinks    Comment: socially  . Drug use: No     Medication list has been reviewed and updated.  Current Meds  Medication Sig  . ALPRAZolam (XANAX) 0.25 MG tablet Take 1 tablet (0.25 mg total) by mouth daily as needed.  . clotrimazole (LOTRIMIN) 1 % cream Apply 1 application topically 2 (two) times daily.  [name] esomeprazole (NEXIUM) 20 MG capsule Take 20 mg by mouth daily at 12 noon. otc  . lisinopril-hydrochlorothiazide (PRINZIDE,ZESTORETIC) 10-12.5 MG tablet Take 1 tablet by mouth daily.  . pantoprazole (PROTONIX) 40 MG tablet Take 1 tablet (40 mg total) by mouth daily.  . [DISCONTINUED] ALPRAZolam (XANAX) 0.25 MG tablet Take 1 tablet (0.25 mg total) by mouth daily as needed.  . [DISCONTINUED] hydrocortisone cream 1 % Apply 1 application topically 2 (two) times daily.  . [DISCONTINUED] lisinopril-hydrochlorothiazide (PRINZIDE,ZESTORETIC) 10-12.5 MG tablet Take 1 tablet by mouth daily.  . [DISCONTINUED] pantoprazole (PROTONIX) 40 MG tablet Take 1 tablet (40 mg total) by mouth daily.    PHQ 2/9 Scores 09/04/2018 04/04/2018 04/04/2018 02/24/2016  PHQ - 2 Score 6 0 0 0  PHQ- 9 Score 18 0 - -    Physical Exam  Constitutional: She is oriented to person, place, and time. She is irritable. No distress.  HENT:  Head: Normocephalic and atraumatic.  Right Ear: External ear normal.  Left Ear: External ear normal.  Nose: Nose  normal.  Mouth/Throat: Oropharynx is clear and moist.  Eyes: Pupils are equal, round, and reactive to light. Conjunctivae and EOM are normal. Right eye exhibits no discharge. Left eye exhibits no discharge.  Neck: Normal range of motion. Neck supple. No JVD present. No thyromegaly present.  Cardiovascular: Normal rate, regular rhythm, normal heart sounds and intact distal pulses. Exam reveals no gallop and no friction rub.  No murmur heard. Pulmonary/Chest: Effort normal and breath sounds normal. No stridor. No respiratory distress. She has no wheezes. She has no rales. She exhibits no tenderness.  Abdominal: Soft. Bowel sounds are normal. She exhibits no mass. There is no tenderness. There is no guarding.  Musculoskeletal: Normal range of motion. She exhibits no edema.  Lymphadenopathy:    She has no cervical adenopathy.  Neurological: She is alert and oriented to person, place, and time. She has normal reflexes.  Skin: Skin is warm and dry. She is not diaphoretic.  Nursing note and vitals reviewed.   BP 124/80   Pulse 80   Ht '5\' 3"'$  (1.6 m)   Wt 220 lb (99.8 kg)   BMI 38.97 kg/m   Assessment and Plan:  1. Acute anxiety Acute Episodic Continue alprazolam PRN panic attack - ALPRAZolam (XANAX) 0.25 MG tablet; Take 1 tablet (0.25 mg total) by mouth daily as needed.  Dispense: 30 tablet; Refill: 0  2. Essential hypertension Chronic Controlled. Continue lisinopril-HCTZ daily - lisinopril-hydrochlorothiazide (PRINZIDE,ZESTORETIC) 10-12.5 MG tablet; Take 1 tablet by mouth daily.  Dispense: 90 tablet; Refill: 1  3. Gastroesophageal reflux disease, esophagitis presence not specified Chronic Controlled Continue pantoprazole 40 mg daily. - pantoprazole (PROTONIX) 40 MG tablet; Take 1 tablet (40  mg total) by mouth daily.  Dispense: 90 tablet; Refill: 3  4. Current mild episode of major depressive disorder, unspecified whether recurrent ([place]) New onset. Uncontrolled Stable. Will initiate  sertraline 50 mg daily. - sertraline (ZOLOFT) 50 MG tablet; Take 1 tablet (50 mg total) by mouth daily. Initiate as one half tablet every day for 2 weeks  Dispense: 30 tablet; Refill: 3 - TSH  5. Fatigue, unspecified type New onset Will check tsh level for thyroid concern. - TSH  6. Enlarged skin mole Noted nevus change in size of right pectora area and facial /periorbital area..Referral to dermatology - Ambulatory referral to Dermatology   Dr. Otilio Miu [facility]  09/04/2018

## 2018-09-05 LAB — TSH: TSH: 3.6 u[IU]/mL (ref 0.450–4.500)

## 2018-10-15 DIAGNOSIS — J019 Acute sinusitis, unspecified: Secondary | ICD-10-CM | POA: Diagnosis not present

## 2018-11-04 ENCOUNTER — Ambulatory Visit: Payer: BLUE CROSS/BLUE SHIELD | Admitting: Family Medicine

## 2018-11-04 ENCOUNTER — Encounter: Payer: Self-pay | Admitting: Family Medicine

## 2018-11-04 VITALS — BP 112/80 | HR 80 | Ht 63.0 in | Wt 221.0 lb

## 2018-11-04 DIAGNOSIS — I10 Essential (primary) hypertension: Secondary | ICD-10-CM | POA: Diagnosis not present

## 2018-11-04 DIAGNOSIS — F320 Major depressive disorder, single episode, mild: Secondary | ICD-10-CM | POA: Diagnosis not present

## 2018-11-04 DIAGNOSIS — F419 Anxiety disorder, unspecified: Secondary | ICD-10-CM

## 2018-11-04 DIAGNOSIS — D229 Melanocytic nevi, unspecified: Secondary | ICD-10-CM | POA: Diagnosis not present

## 2018-11-04 MED ORDER — SERTRALINE HCL 50 MG PO TABS: 50.0000 mg | ORAL_TABLET | Freq: Every day | ORAL | 1 refills | Status: DC

## 2018-11-04 MED ORDER — ALPRAZOLAM 0.25 MG PO TABS: 0.2500 mg | ORAL_TABLET | Freq: Every day | ORAL | 1 refills | Status: DC | PRN

## 2018-11-04 MED ORDER — LISINOPRIL-HYDROCHLOROTHIAZIDE 10-12.5 MG PO TABS: 1.0000 | ORAL_TABLET | Freq: Every day | ORAL | 1 refills | Status: DC

## 2018-11-04 NOTE — Progress Notes (Signed)
Date:  11/04/2018   Name:  Krista Harrison   DOB:  [birth year]   MRN:  [id number]   Chief Complaint: Follow-up (depression and anxiety PHQ=0) and Anxiety (refill alprazolam and sertraline)  Patient concern for raised nevus of right cheek area.  Anxiety  Presents for follow-up visit. Patient reports no chest pain, compulsions, confusion, decreased concentration, depressed mood, dizziness, dry mouth, excessive worry, feeling of choking, hyperventilation, impotence, insomnia, irritability, malaise, muscle tension, nausea, nervous/anxious behavior, obsessions, palpitations, panic, restlessness, shortness of breath or suicidal ideas. Symptoms occur occasionally. The severity of symptoms is mild. The quality of sleep is non-restorative. Nighttime awakenings: early a.m. for rest of night.    Hypertension  This is a chronic problem. The current episode started more than 1 year ago. The problem is unchanged. The problem is controlled. Associated symptoms include anxiety. Pertinent negatives include no blurred vision, chest pain, headaches, malaise/fatigue, neck pain, orthopnea, palpitations, peripheral edema, PND, shortness of breath or sweats. There are no associated agents to hypertension. There are no known risk factors for coronary artery disease. Past treatments include ACE inhibitors and diuretics. The current treatment provides moderate improvement. There are no compliance problems.  There is no history of angina, kidney disease, CAD/MI, CVA, heart failure, left ventricular hypertrophy, PVD or retinopathy. There is no history of chronic renal disease, a hypertension causing med or renovascular disease.    Review of Systems  Constitutional: Negative.  Negative for chills, fatigue, fever, irritability, malaise/fatigue and unexpected weight change.  HENT: Negative for congestion, ear discharge, ear pain, rhinorrhea, sinus pressure, sneezing and sore throat.   Eyes: Negative for blurred vision,  photophobia, pain, discharge, redness and itching.  Respiratory: Negative for cough, shortness of breath, wheezing and stridor.   Cardiovascular: Negative for chest pain, palpitations, orthopnea and PND.  Gastrointestinal: Negative for abdominal pain, blood in stool, constipation, diarrhea, nausea and vomiting.  Endocrine: Negative for cold intolerance, heat intolerance, polydipsia, polyphagia and polyuria.  Genitourinary: Negative for dysuria, flank pain, frequency, hematuria, impotence, menstrual problem, pelvic pain, urgency, vaginal bleeding and vaginal discharge.  Musculoskeletal: Negative for arthralgias, back pain, myalgias and neck pain.  Skin: Negative for rash.  Allergic/Immunologic: Negative for environmental allergies and food allergies.  Neurological: Negative for dizziness, weakness, light-headedness, numbness and headaches.  Hematological: Negative for adenopathy. Does not bruise/bleed easily.  Psychiatric/Behavioral: Negative for confusion, decreased concentration, dysphoric mood and suicidal ideas. The patient is not nervous/anxious and does not have insomnia.     Patient Active Problem List   Diagnosis Date Noted  . Pure hypercholesterolemia 04/04/2018  . Rash, skin 04/04/2018  . BRCA gene mutation negative 03/21/2017  . Essential hypertension 09/25/2016  . Gastroesophageal reflux disease 09/25/2016  . Acute anxiety 09/25/2016    No Known Allergies  Past Surgical History:  Procedure Laterality Date  . ABDOMINAL HYSTERECTOMY  2009  . ROTATOR CUFF REPAIR Bilateral 2005, 2006    Social History   Tobacco Use  . Smoking status: Never Smoker  . Smokeless tobacco: Never Used  Substance Use Topics  . Alcohol use: Yes    Alcohol/week: 0.0 standard drinks    Comment: socially  . Drug use: No     Medication list has been reviewed and updated.  Current Meds  Medication Sig  . ALPRAZolam (XANAX) 0.25 MG tablet Take 1 tablet (0.25 mg total) by mouth daily as  needed.  . clotrimazole (LOTRIMIN) 1 % cream Apply 1 application topically 2 (two) times daily.  [name] esomeprazole ([place]) 20  MG capsule Take 20 mg by mouth daily at 12 noon. otc  . lisinopril-hydrochlorothiazide (PRINZIDE,ZESTORETIC) 10-12.5 MG tablet Take 1 tablet by mouth daily.  . sertraline (ZOLOFT) 50 MG tablet Take 1 tablet (50 mg total) by mouth daily. Initiate as one half tablet every day for 2 weeks  . [DISCONTINUED] pantoprazole (PROTONIX) 40 MG tablet Take 1 tablet (40 mg total) by mouth daily.    PHQ 2/9 Scores 11/04/2018 09/04/2018 04/04/2018 04/04/2018  PHQ - 2 Score 0 6 0 0  PHQ- 9 Score 0 18 0 -    Physical Exam Vitals signs and nursing note reviewed.  Constitutional:      General: She is not in acute distress.    Appearance: She is not diaphoretic.  HENT:     Head: Normocephalic and atraumatic.     Right Ear: External ear normal.     Left Ear: External ear normal.     Nose: Nose normal.  Eyes:     General:        Right eye: No discharge.        Left eye: No discharge.     Conjunctiva/sclera: Conjunctivae normal.     Pupils: Pupils are equal, round, and reactive to light.  Neck:     Musculoskeletal: Normal range of motion and neck supple.     Thyroid: No thyromegaly.     Vascular: No JVD.  Cardiovascular:     Rate and Rhythm: Normal rate and regular rhythm.     Heart sounds: Normal heart sounds. No murmur. No friction rub. No gallop.   Pulmonary:     Effort: Pulmonary effort is normal.     Breath sounds: Normal breath sounds.  Abdominal:     General: Bowel sounds are normal.     Palpations: Abdomen is soft. There is no mass.     Tenderness: There is no abdominal tenderness. There is no guarding.  Musculoskeletal: Normal range of motion.  Lymphadenopathy:     Cervical: No cervical adenopathy.  Skin:    General: Skin is warm and dry.  Neurological:     Mental Status: She is alert.     Deep Tendon Reflexes: Reflexes are normal and symmetric.     BP  112/80   Pulse 80   Ht _0  (1.6 m)   Wt 221 lb (100.2 kg)   BMI 39.15 kg/m   Assessment and Plan: 1. Acute anxiety Chronic. Stable. Takes Alprazolam every other day as needed for anxiety/ work related.  - ALPRAZolam (XANAX) 0.25 MG tablet; Take 1 tablet (0.25 mg total) by mouth daily as needed.  Dispense: 30 tablet; Refill: 1  2. Current mild episode of major depressive disorder, unspecified whether recurrent (HCC) Chronic. Stable on med- refill Sertraline 6m daily. PHQ9=0  - sertraline (ZOLOFT) 50 MG tablet; Take 1 tablet (50 mg total) by mouth daily. Initiate as one half tablet every day for 2 weeks  Dispense: 90 tablet; Refill: 1  3. Essential hypertension Chronic. Stable/ controlled on med- refill lisinopril-HCTZ - lisinopril-hydrochlorothiazide (PRINZIDE,ZESTORETIC) 10-12.5 MG tablet; Take 1 tablet by mouth daily.  Dispense: 90 tablet; Refill: 1  4. Nevus Referral to dermatology for mole - Ambulatory referral to Dermatology

## 2018-11-25 DIAGNOSIS — J09X2 Influenza due to identified novel influenza A virus with other respiratory manifestations: Secondary | ICD-10-CM | POA: Diagnosis not present

## 2018-11-27 ENCOUNTER — Ambulatory Visit
Admission: RE | Admit: 2018-11-27 | Discharge: 2018-11-27 | Disposition: A | Discharge status: Home / Self Care | Payer: BLUE CROSS/BLUE SHIELD | Attending: Family Medicine | Admitting: Family Medicine

## 2018-11-27 ENCOUNTER — Other Ambulatory Visit
Admission: RE | Admit: 2018-11-27 | Discharge: 2018-11-27 | Disposition: A | Discharge status: Home / Self Care | Payer: BLUE CROSS/BLUE SHIELD | Source: Home / Self Care | Attending: Family Medicine | Admitting: Family Medicine

## 2018-11-27 ENCOUNTER — Ambulatory Visit: Payer: BLUE CROSS/BLUE SHIELD | Admitting: Family Medicine

## 2018-11-27 ENCOUNTER — Encounter: Payer: Self-pay | Admitting: Family Medicine

## 2018-11-27 ENCOUNTER — Ambulatory Visit
Admission: RE | Admit: 2018-11-27 | Discharge: 2018-11-27 | Disposition: A | Discharge status: Home / Self Care | Payer: BLUE CROSS/BLUE SHIELD | Source: Ambulatory Visit | Attending: Family Medicine | Admitting: Family Medicine

## 2018-11-27 VITALS — BP 120/72 | HR 88 | Temp 100.1°F | Ht 63.0 in | Wt 222.0 lb

## 2018-11-27 DIAGNOSIS — J101 Influenza due to other identified influenza virus with other respiratory manifestations: Secondary | ICD-10-CM | POA: Insufficient documentation

## 2018-11-27 DIAGNOSIS — B999 Unspecified infectious disease: Secondary | ICD-10-CM

## 2018-11-27 DIAGNOSIS — R05 Cough: Secondary | ICD-10-CM | POA: Insufficient documentation

## 2018-11-27 DIAGNOSIS — R5081 Fever presenting with conditions classified elsewhere: Secondary | ICD-10-CM | POA: Insufficient documentation

## 2018-11-27 DIAGNOSIS — R059 Cough, unspecified: Secondary | ICD-10-CM

## 2018-11-27 LAB — CBC WITH DIFFERENTIAL/PLATELET
Abs Immature Granulocytes: 0.01 10*3/uL (ref 0.00–0.07)
Basophils Absolute: 0 10*3/uL (ref 0.0–0.1)
Basophils Relative: 0 %
Eosinophils Absolute: 0.1 10*3/uL (ref 0.0–0.5)
Eosinophils Relative: 1 %
HCT: 43.4 % (ref 36.0–46.0)
Hemoglobin: 14.3 g/dL (ref 12.0–15.0)
Immature Granulocytes: 0 %
Lymphocytes Relative: 30 %
Lymphs Abs: 1.7 10*3/uL (ref 0.7–4.0)
MCH: 31.8 pg (ref 26.0–34.0)
MCHC: 32.9 g/dL (ref 30.0–36.0)
MCV: 96.4 fL (ref 80.0–100.0)
Monocytes Absolute: 0.6 10*3/uL (ref 0.1–1.0)
Monocytes Relative: 11 %
Neutro Abs: 3.3 10*3/uL (ref 1.7–7.7)
Neutrophils Relative %: 58 %
Platelets: 210 10*3/uL (ref 150–400)
RBC: 4.5 MIL/uL (ref 3.87–5.11)
RDW: 13.4 % (ref 11.5–15.5)
WBC: 5.7 10*3/uL (ref 4.0–10.5)
nRBC: 0 % (ref 0.0–0.2)

## 2018-11-27 MED ORDER — AZITHROMYCIN 250 MG PO TABS: ORAL_TABLET | ORAL | 0 refills | Status: DC

## 2018-11-27 NOTE — Progress Notes (Signed)
Date:  11/27/2018   Name:  Krista Harrison   DOB:  October 28, 1960   MRN:  [id number]   Chief Complaint: Follow-up (went to urgent care on Monday/ Dx with influenza A- has been taking pred 78m qday and tamiflu bid x 3 days. felt better yesterday, but then the cough changed to a "gurgling" sound. )  Fever   This is a recurrent problem. The current episode started in the past 7 days. The problem occurs 2 to 4 times per day. The problem has been gradually worsening. The maximum temperature noted was 101 to 101.9 F. The temperature was taken using an oral thermometer. Associated symptoms include congestion, coughing and wheezing. Pertinent negatives include no abdominal pain, chest pain, diarrhea, ear pain, headaches, muscle aches, nausea, rash, sleepiness, sore throat, urinary pain or vomiting. She has tried acetaminophen and NSAIDs for the symptoms. The treatment provided moderate relief.  Risk factors: sick contacts   Risk factors: no contaminated food, no contaminated water, no hx of cancer, no immunosuppression, no occupational exposure, no recent sickness and no recent travel   Cough  This is a new problem. The current episode started in the past 7 days. The problem has been gradually worsening. The problem occurs every few minutes. The cough is non-productive. Associated symptoms include a fever and wheezing. Pertinent negatives include no chest pain, chills, ear congestion, ear pain, eye redness, headaches, heartburn, hemoptysis, myalgias, nasal congestion, postnasal drip, rash, rhinorrhea, sore throat, shortness of breath, sweats or weight loss. There is no history of environmental allergies.    Review of Systems  Constitutional: Positive for fever. Negative for chills, fatigue, unexpected weight change and weight loss.  HENT: Positive for congestion. Negative for ear discharge, ear pain, postnasal drip, rhinorrhea, sinus pressure, sneezing and sore throat.   Eyes: Negative for photophobia,  pain, discharge, redness and itching.  Respiratory: Positive for cough and wheezing. Negative for hemoptysis, shortness of breath and stridor.   Cardiovascular: Negative for chest pain, palpitations and leg swelling.  Gastrointestinal: Negative for abdominal pain, blood in stool, constipation, diarrhea, heartburn, nausea and vomiting.  Endocrine: Negative for cold intolerance, heat intolerance, polydipsia, polyphagia and polyuria.  Genitourinary: Negative for dysuria, flank pain, frequency, hematuria, menstrual problem, pelvic pain, urgency, vaginal bleeding and vaginal discharge.  Musculoskeletal: Negative for arthralgias, back pain and myalgias.  Skin: Negative for rash.  Allergic/Immunologic: Negative for environmental allergies and food allergies.  Neurological: Negative for dizziness, weakness, light-headedness, numbness and headaches.  Hematological: Negative for adenopathy. Does not bruise/bleed easily.  Psychiatric/Behavioral: Negative for dysphoric mood. The patient is not nervous/anxious.     Patient Active Problem List   Diagnosis Date Noted  . Pure hypercholesterolemia 04/04/2018  . Rash, skin 04/04/2018  . BRCA gene mutation negative 03/21/2017  . Essential hypertension 09/25/2016  . Gastroesophageal reflux disease 09/25/2016  . Acute anxiety 09/25/2016    No Known Allergies  Past Surgical History:  Procedure Laterality Date  . ABDOMINAL HYSTERECTOMY  2009  . ROTATOR CUFF REPAIR Bilateral 2005, 2006    Social History   Tobacco Use  . Smoking status: Never Smoker  . Smokeless tobacco: Never Used  Substance Use Topics  . Alcohol use: Yes    Alcohol/week: 0.0 standard drinks    Comment: socially  . Drug use: No     Medication list has been reviewed and updated.  No outpatient medications have been marked as taking for the 11/27/18 encounter (Office Visit) with JJuline Patch MD.  PHQ 2/9 Scores 11/04/2018 09/04/2018 04/04/2018 04/04/2018  PHQ - 2 Score 0 6  0 0  PHQ- 9 Score 0 18 0 -    Physical Exam Vitals signs and nursing note reviewed.  Constitutional:      General: She is not in acute distress.    Appearance: She is not diaphoretic.  HENT:     Head: Normocephalic and atraumatic.     Right Ear: External ear normal.     Left Ear: External ear normal.     Nose: Nose normal.  Eyes:     General:        Right eye: No discharge.        Left eye: No discharge.     Conjunctiva/sclera: Conjunctivae normal.     Pupils: Pupils are equal, round, and reactive to light.  Neck:     Musculoskeletal: Normal range of motion and neck supple.     Thyroid: No thyromegaly.     Vascular: No JVD.  Cardiovascular:     Rate and Rhythm: Normal rate and regular rhythm.     Heart sounds: Normal heart sounds. No murmur. No friction rub. No gallop.   Pulmonary:     Effort: Pulmonary effort is normal.     Breath sounds: Normal breath sounds.  Abdominal:     General: Bowel sounds are normal.     Palpations: Abdomen is soft. There is no mass.     Tenderness: There is no abdominal tenderness. There is no guarding.  Musculoskeletal: Normal range of motion.        General: No swelling, tenderness, deformity or signs of injury.  Lymphadenopathy:     Cervical: No cervical adenopathy.  Skin:    General: Skin is warm and dry.  Neurological:     Mental Status: She is alert.     Deep Tendon Reflexes: Reflexes are normal and symmetric.     BP 120/72   Pulse 88   Temp 100.1 F (37.8 C) (Oral)   Ht _0  (1.6 m)   Wt 222 lb (100.7 kg)   BMI 39.33 kg/m   Assessment and Plan: 1. Cough Patient has a resumption of a cough after being on Tamiflu for documented influenza A.  She has also resumed having a fever cover for community acquired pneumonia with a azithromycin and obtain a chest film with CBC. - azithromycin (ZITHROMAX) 250 MG tablet; 2 today then 1 a day for 4 days  Dispense: 6 tablet; Refill: 0 - DG Chest 2 View; Future  2. Influenza A Patient  evaluated and treated in urgent care with documented influenza A.  Continue Tamiflu until completion. - DG Chest 2 View; Future  3. Fever due to infection Patient with noted fever and will initiate a azithromycin on set dose. - azithromycin (ZITHROMAX) 250 MG tablet; 2 today then 1 a day for 4 days  Dispense: 6 tablet; Refill: 0 - DG Chest 2 View; Future

## 2018-12-02 ENCOUNTER — Other Ambulatory Visit: Payer: Self-pay

## 2018-12-02 ENCOUNTER — Emergency Department: Payer: BLUE CROSS/BLUE SHIELD

## 2018-12-02 ENCOUNTER — Emergency Department
Admission: EM | Admit: 2018-12-02 | Discharge: 2018-12-02 | Disposition: A | Discharge status: Home / Self Care | Payer: BLUE CROSS/BLUE SHIELD | Attending: Emergency Medicine | Admitting: Emergency Medicine

## 2018-12-02 ENCOUNTER — Encounter: Payer: Self-pay | Admitting: Emergency Medicine

## 2018-12-02 DIAGNOSIS — H81399 Other peripheral vertigo, unspecified ear: Secondary | ICD-10-CM | POA: Diagnosis not present

## 2018-12-02 DIAGNOSIS — F419 Anxiety disorder, unspecified: Secondary | ICD-10-CM | POA: Diagnosis not present

## 2018-12-02 DIAGNOSIS — I10 Essential (primary) hypertension: Secondary | ICD-10-CM | POA: Insufficient documentation

## 2018-12-02 DIAGNOSIS — R42 Dizziness and giddiness: Secondary | ICD-10-CM | POA: Insufficient documentation

## 2018-12-02 DIAGNOSIS — R05 Cough: Secondary | ICD-10-CM | POA: Diagnosis not present

## 2018-12-02 DIAGNOSIS — I4510 Unspecified right bundle-branch block: Secondary | ICD-10-CM | POA: Diagnosis not present

## 2018-12-02 LAB — URINALYSIS, COMPLETE (UACMP) WITH MICROSCOPIC
Bilirubin Urine: NEGATIVE
Glucose, UA: NEGATIVE mg/dL
Hgb urine dipstick: NEGATIVE
Ketones, ur: NEGATIVE mg/dL
Leukocytes,Ua: NEGATIVE
Nitrite: NEGATIVE
Protein, ur: NEGATIVE mg/dL
Specific Gravity, Urine: 1.003 — ABNORMAL LOW (ref 1.005–1.030)
pH: 7 (ref 5.0–8.0)

## 2018-12-02 LAB — CBC
HCT: 44.6 % (ref 36.0–46.0)
Hemoglobin: 14.7 g/dL (ref 12.0–15.0)
MCH: 30.9 pg (ref 26.0–34.0)
MCHC: 33 g/dL (ref 30.0–36.0)
MCV: 93.7 fL (ref 80.0–100.0)
Platelets: 303 10*3/uL (ref 150–400)
RBC: 4.76 MIL/uL (ref 3.87–5.11)
RDW: 13 % (ref 11.5–15.5)
WBC: 10.9 10*3/uL — ABNORMAL HIGH (ref 4.0–10.5)
nRBC: 0 % (ref 0.0–0.2)

## 2018-12-02 LAB — BASIC METABOLIC PANEL
Anion gap: 9 (ref 5–15)
BUN: 7 mg/dL (ref 6–20)
CO2: 25 mmol/L (ref 22–32)
Calcium: 9 mg/dL (ref 8.9–10.3)
Chloride: 104 mmol/L (ref 98–111)
Creatinine, Ser: 0.72 mg/dL (ref 0.44–1.00)
GFR calc Af Amer: >60 mL/min (ref >60)
GFR calc non Af Amer: >60 mL/min (ref >60)
Glucose, Bld: 122 mg/dL — ABNORMAL HIGH (ref 70–99)
Potassium: 3.7 mmol/L (ref 3.5–5.1)
Sodium: 138 mmol/L (ref 135–145)

## 2018-12-02 LAB — GLUCOSE, CAPILLARY: Glucose-Capillary: 118 mg/dL — ABNORMAL HIGH (ref 70–99)

## 2018-12-02 MED ORDER — DIAZEPAM 5 MG PO TABS
5.0000 mg | ORAL_TABLET | Freq: Once | ORAL | Status: AC
  Administered 2018-12-02: 5 mg via ORAL
  Filled 2018-12-02: qty 1

## 2018-12-02 MED ORDER — MECLIZINE HCL 25 MG PO TABS: 25.0000 mg | ORAL_TABLET | Freq: Three times a day (TID) | ORAL | 1 refills | Status: DC | PRN

## 2018-12-02 MED ORDER — HYDROCOD POLST-CPM POLST ER 10-8 MG/5ML PO SUER: 5.0000 mL | Freq: Two times a day (BID) | ORAL | 0 refills | Status: DC

## 2018-12-02 MED ORDER — DIAZEPAM 5 MG PO TABS: ORAL_TABLET | ORAL | 0 refills | Status: DC

## 2018-12-02 MED ORDER — MECLIZINE HCL 25 MG PO TABS
50.0000 mg | ORAL_TABLET | Freq: Once | ORAL | Status: AC
  Administered 2018-12-02: 50 mg via ORAL
  Filled 2018-12-02 (×2): qty 2

## 2018-12-02 NOTE — ED Provider Notes (Signed)
[facility] Emergency Department Provider Note       Time seen: ----------------------------------------- 1:36 PM on 12/02/2018 -----------------------------------------   I have reviewed the triage vital signs and the nursing notes.  HISTORY   Chief Complaint Dizziness; Numbness; and Polydipsia    HPI Krista Harrison is a 58 y.o. female with a history of anxiety, GERD, hypertension who presents to the ED for dizziness.  Patient describes room spinning sensation.  She reports she had the flu last week and thought she was over it.  Today she described a feeling like the room is spinning.  She has had some residual cough.  She denies fevers, chills or other complaints.  Past Medical History:  Diagnosis Date  . Anxiety   . Factor V Leiden ([place])    heterozygous  . GERD (gastroesophageal reflux disease)   . Hypertension     Patient Active Problem List   Diagnosis Date Noted  . Pure hypercholesterolemia 04/04/2018  . Rash, skin 04/04/2018  . BRCA gene mutation negative 03/21/2017  . Essential hypertension 09/25/2016  . Gastroesophageal reflux disease 09/25/2016  . Acute anxiety 09/25/2016    Past Surgical History:  Procedure Laterality Date  . ABDOMINAL HYSTERECTOMY  2009  . ROTATOR CUFF REPAIR Bilateral 2005, 2006    Allergies Patient has no known allergies.  Social History Social History   Tobacco Use  . Smoking status: Never Smoker  . Smokeless tobacco: Never Used  Substance Use Topics  . Alcohol use: Yes    Alcohol/week: 0.0 standard drinks    Comment: socially  . Drug use: No   Review of Systems Constitutional: Negative for fever. Cardiovascular: Negative for chest pain. Respiratory: Negative for shortness of breath. Gastrointestinal: Negative for abdominal pain, vomiting and diarrhea. Musculoskeletal: Negative for back pain. Skin: Negative for rash. Neurological: Negative for headaches, focal weakness or numbness.  Positive  for dizziness  All systems negative/normal/unremarkable except as stated in the HPI  ____________________________________________   PHYSICAL EXAM:  VITAL SIGNS: ED Triage Vitals  Enc Vitals Group     BP 12/02/18 1113 (!) 168/107     Pulse Rate 12/02/18 1113 (!) 109     Resp --      Temp 12/02/18 1113 98.3 F (36.8 C)     Temp Source 12/02/18 1113 Oral     SpO2 12/02/18 1113 98 %     Weight 12/02/18 1114 222 lb (100.7 kg)     Height 12/02/18 1114 '5\' 3"'  (1.6 m)     Head Circumference --      Peak Flow --      Pain Score 12/02/18 1122 0     Pain Loc --      Pain Edu? --      Excl. in [place]? --    Constitutional: Alert and oriented. Well appearing and in no distress. Eyes: Conjunctivae are normal. Normal extraocular movements. Cardiovascular: Normal rate, regular rhythm. No murmurs, rubs, or gallops. Respiratory: Normal respiratory effort without tachypnea nor retractions. Breath sounds are clear and equal bilaterally. No wheezes/rales/rhonchi. Gastrointestinal: Soft and nontender. Normal bowel sounds Musculoskeletal: Nontender with normal range of motion in extremities. No lower extremity tenderness nor edema. Neurologic:  Normal speech and language. No gross focal neurologic deficits are appreciated.  Skin:  Skin is warm, dry and intact. No rash noted. Psychiatric: Mood and affect are normal. Speech and behavior are normal.  ____________________________________________   EKG: Interpreted by me.  Sinus rhythm the rate of 96 bpm, right bundle  branch block, normal axis, normal QT  ____________________________________________  ED COURSE:  As part of my medical decision making, I reviewed the following data within the [facility] History obtained from family if available, nursing notes, old chart and ekg, as well as notes from prior ED visits. Patient presented for dizziness and possible vertigo, we will assess with labs and imaging as indicated at this time.    Procedures ____________________________________________   LABS (pertinent positives/negatives)  Labs Reviewed  BASIC METABOLIC PANEL - Abnormal; Notable for the following components:      Result Value   Glucose, Bld 122 (*)    All other components within normal limits  CBC - Abnormal; Notable for the following components:   WBC 10.9 (*)    All other components within normal limits  URINALYSIS, COMPLETE (UACMP) WITH MICROSCOPIC - Abnormal; Notable for the following components:   Color, Urine STRAW (*)    APPearance CLEAR (*)    Specific Gravity, Urine 1.003 (*)    Bacteria, UA RARE (*)    All other components within normal limits  GLUCOSE, CAPILLARY - Abnormal; Notable for the following components:   Glucose-Capillary 118 (*)    All other components within normal limits  CBG MONITORING, ED   ____________________________________________   DIFFERENTIAL DIAGNOSIS   Vertigo, dehydration, electrolyte abnormality, anxiety, CVA unlikely  FINAL ASSESSMENT AND PLAN  Peripheral vertigo   Plan: The patient had presented for vertiginous symptoms. Patient's labs are reassuring. Patient's imaging not reveal any acute process.  She is cleared for outpatient follow-up with meclizine and Valium to take as needed.   Laurence Aly, MD    Note: This note was generated in part or whole with voice recognition software. Voice recognition is usually quite accurate but there are transcription errors that can and very often do occur. I apologize for any typographical errors that were not detected and corrected.     Earleen Newport, MD 12/02/18 [phone]

## 2018-12-02 NOTE — ED Notes (Signed)
Pt c/o having dizziness that started today, was recently sick with flu sx. Pt states she has a hx of vertigo in the past,.

## 2018-12-02 NOTE — ED Triage Notes (Signed)
Says she had flu last week.  Thought she was over it.  Today she says she feels funny --tingling, dizzy and feels odd.  She is in chair in nad.  No change in dizziness with position changes.

## 2018-12-02 NOTE — ED Notes (Signed)
ED Provider at bedside. 

## 2019-01-14 DIAGNOSIS — L918 Other hypertrophic disorders of the skin: Secondary | ICD-10-CM | POA: Diagnosis not present

## 2019-01-14 DIAGNOSIS — L570 Actinic keratosis: Secondary | ICD-10-CM | POA: Diagnosis not present

## 2019-01-14 DIAGNOSIS — L821 Other seborrheic keratosis: Secondary | ICD-10-CM | POA: Diagnosis not present

## 2019-01-14 DIAGNOSIS — D2239 Melanocytic nevi of other parts of face: Secondary | ICD-10-CM | POA: Diagnosis not present

## 2019-02-09 ENCOUNTER — Other Ambulatory Visit: Payer: Self-pay | Admitting: Family Medicine

## 2019-02-09 DIAGNOSIS — F320 Major depressive disorder, single episode, mild: Secondary | ICD-10-CM

## 2019-03-14 ENCOUNTER — Other Ambulatory Visit (HOSPITAL_COMMUNITY): Payer: Self-pay | Admitting: Orthopedic Surgery

## 2019-03-14 ENCOUNTER — Ambulatory Visit (HOSPITAL_COMMUNITY)
Admission: RE | Admit: 2019-03-14 | Discharge: 2019-03-14 | Disposition: A | Discharge status: Home / Self Care | Payer: BLUE CROSS/BLUE SHIELD | Source: Ambulatory Visit | Attending: Internal Medicine | Admitting: Internal Medicine

## 2019-03-14 ENCOUNTER — Other Ambulatory Visit: Payer: Self-pay

## 2019-03-14 DIAGNOSIS — S7001XA Contusion of right hip, initial encounter: Secondary | ICD-10-CM | POA: Diagnosis not present

## 2019-03-14 DIAGNOSIS — M7989 Other specified soft tissue disorders: Secondary | ICD-10-CM | POA: Diagnosis not present

## 2019-03-14 DIAGNOSIS — S8002XA Contusion of left knee, initial encounter: Secondary | ICD-10-CM | POA: Diagnosis not present

## 2019-03-14 DIAGNOSIS — M79661 Pain in right lower leg: Secondary | ICD-10-CM | POA: Diagnosis not present

## 2019-03-14 DIAGNOSIS — S9002XA Contusion of left ankle, initial encounter: Secondary | ICD-10-CM | POA: Diagnosis not present

## 2019-04-11 DIAGNOSIS — M25552 Pain in left hip: Secondary | ICD-10-CM | POA: Diagnosis not present

## 2019-04-23 ENCOUNTER — Other Ambulatory Visit: Payer: Self-pay | Admitting: Family Medicine

## 2019-04-23 DIAGNOSIS — F320 Major depressive disorder, single episode, mild: Secondary | ICD-10-CM

## 2019-05-01 ENCOUNTER — Other Ambulatory Visit: Payer: Self-pay

## 2019-05-01 ENCOUNTER — Ambulatory Visit
Admission: EM | Admit: 2019-05-01 | Discharge: 2019-05-01 | Disposition: A | Discharge status: Home / Self Care | Payer: BC Managed Care – PPO | Attending: Physician Assistant | Admitting: Physician Assistant

## 2019-05-01 DIAGNOSIS — B9689 Other specified bacterial agents as the cause of diseases classified elsewhere: Secondary | ICD-10-CM | POA: Diagnosis not present

## 2019-05-01 DIAGNOSIS — N3090 Cystitis, unspecified without hematuria: Secondary | ICD-10-CM

## 2019-05-01 DIAGNOSIS — I10 Essential (primary) hypertension: Secondary | ICD-10-CM | POA: Diagnosis not present

## 2019-05-01 LAB — POCT URINALYSIS DIP (MANUAL ENTRY)
Bilirubin, UA: NEGATIVE
Glucose, UA: NEGATIVE mg/dL
Ketones, POC UA: NEGATIVE mg/dL
Nitrite, UA: NEGATIVE
Protein Ur, POC: NEGATIVE mg/dL
Spec Grav, UA: 1.015 (ref 1.010–1.025)
Urobilinogen, UA: 0.2 E.U./dL
pH, UA: 7.5 (ref 5.0–8.0)

## 2019-05-01 MED ORDER — PHENAZOPYRIDINE HCL 200 MG PO TABS: 200.0000 mg | ORAL_TABLET | Freq: Three times a day (TID) | ORAL | 0 refills | Status: DC

## 2019-05-01 MED ORDER — CEPHALEXIN 500 MG PO CAPS: 500.0000 mg | ORAL_CAPSULE | Freq: Two times a day (BID) | ORAL | 0 refills | Status: DC

## 2019-05-01 NOTE — ED Triage Notes (Signed)
Pt c/o uti sx's since this am. C/o urinary burning and frequency

## 2019-05-01 NOTE — ED Provider Notes (Signed)
[facility]    CSN: [id number] Arrival date & time: 05/01/19  1208      History   Chief Complaint Chief Complaint  Patient presents with  . Urinary Tract Infection    HPI Krista Harrison is a 58 y.o. female.   58 year old female comes in for urinary symptoms starting this morning. States she has had urgency, frequency, dysuria. Denies hematuria. Denies abdominal pain, nausea, vomiting. Denies fever, chills, night sweats. Denies vaginal discharge, itching. Has not taken anything for the symptoms.      Past Medical History:  Diagnosis Date  . Anxiety   . Factor V Leiden ([place])    heterozygous  . GERD (gastroesophageal reflux disease)   . Hypertension     Patient Active Problem List   Diagnosis Date Noted  . Pure hypercholesterolemia 04/04/2018  . Rash, skin 04/04/2018  . BRCA gene mutation negative 03/21/2017  . Essential hypertension 09/25/2016  . Gastroesophageal reflux disease 09/25/2016  . Acute anxiety 09/25/2016    Past Surgical History:  Procedure Laterality Date  . ABDOMINAL HYSTERECTOMY  2009  . ROTATOR CUFF REPAIR Bilateral 2005, 2006    OB History    Gravida  3   Para  1   Term      Preterm      AB  2   Living        SAB  2   TAB      Ectopic      Multiple      Live Births           Obstetric Comments  1st Menstrual Cycle:  13  1st Pregnancy:  28          Home Medications    Prior to Admission medications   Medication Sig Start Date End Date Taking? Authorizing Provider  ALPRAZolam (XANAX) 0.25 MG tablet Take 1 tablet (0.25 mg total) by mouth daily as needed. 11/04/18   Juline Patch, MD  cephALEXin (KEFLEX) 500 MG capsule Take 1 capsule (500 mg total) by mouth 2 (two) times daily. 05/01/19   Tasia Catchings, Kaian Fahs V, PA-C  esomeprazole (NEXIUM) 20 MG capsule Take 20 mg by mouth daily at 12 noon. otc    [provider]  lisinopril-hydrochlorothiazide (PRINZIDE,ZESTORETIC) 10-12.5 MG tablet Take 1 tablet by  mouth daily. 11/04/18   Juline Patch, MD  phenazopyridine (PYRIDIUM) 200 MG tablet Take 1 tablet (200 mg total) by mouth 3 (three) times daily. 05/01/19   Tasia Catchings, Lan Mcneill V, PA-C  sertraline (ZOLOFT) 50 MG tablet Take 1 tablet (50 mg total) by mouth daily. 04/24/19 05/01/19  Juline Patch, MD    Family History Family History  Problem Relation Age of Onset  . Cancer Mother        ovarian  . Breast cancer Mother 15  . COPD Father     Social History Social History   Tobacco Use  . Smoking status: Never Smoker  . Smokeless tobacco: Never Used  Substance Use Topics  . Alcohol use: Yes    Alcohol/week: 0.0 standard drinks    Comment: socially  . Drug use: No     Allergies   Patient has no known allergies.   Review of Systems Review of Systems  Reason unable to perform ROS: See HPI as above.     Physical Exam Triage Vital Signs ED Triage Vitals  Enc Vitals Group     BP 05/01/19 1217 (!) 171/95     Pulse  Rate 05/01/19 1217 98     Resp 05/01/19 1217 18     Temp 05/01/19 1217 98.2 F (36.8 C)     Temp Source 05/01/19 1217 Oral     SpO2 05/01/19 1217 98 %     Weight --      Height --      Head Circumference --      Peak Flow --      Pain Score 05/01/19 1218 8     Pain Loc --      Pain Edu? --      Excl. in [place]? --    No data found.  Updated Vital Signs BP (!) 171/95 (BP Location: Left Arm)   Pulse 98   Temp 98.2 F (36.8 C) (Oral)   Resp 18   SpO2 98%   Visual Acuity Right Eye Distance:   Left Eye Distance:   Bilateral Distance:    Right Eye Near:   Left Eye Near:    Bilateral Near:     Physical Exam Constitutional:      General: She is not in acute distress.    Appearance: She is well-developed. She is not ill-appearing, toxic-appearing or diaphoretic.  HENT:     Head: Normocephalic and atraumatic.  Eyes:     Conjunctiva/sclera: Conjunctivae normal.     Pupils: Pupils are equal, round, and reactive to light.  Cardiovascular:     Rate and Rhythm:  Normal rate and regular rhythm.     Heart sounds: Normal heart sounds. No murmur. No friction rub. No gallop.   Pulmonary:     Effort: Pulmonary effort is normal.     Breath sounds: Normal breath sounds. No wheezing or rales.  Abdominal:     General: Bowel sounds are normal.     Palpations: Abdomen is soft.     Tenderness: There is no abdominal tenderness. There is no right CVA tenderness, left CVA tenderness, guarding or rebound.  Skin:    General: Skin is warm and dry.  Neurological:     Mental Status: She is alert and oriented to person, place, and time.  Psychiatric:        Behavior: Behavior normal.        Judgment: Judgment normal.      UC Treatments / Results  Labs (all labs ordered are listed, but only abnormal results are displayed) Labs Reviewed  POCT URINALYSIS DIP (MANUAL ENTRY) - Abnormal; Notable for the following components:      Result Value   Blood, UA large (*)    Leukocytes, UA Large (3+) (*)    All other components within normal limits  URINE CULTURE    EKG   Radiology No results found.  Procedures Procedures (including critical care time)  Medications Ordered in UC Medications - No data to display  Initial Impression / Assessment and Plan / UC Course  I have reviewed the triage vital signs and the nursing notes.  Pertinent labs & imaging results that were available during my care of the patient were reviewed by me and considered in my medical decision making (see chart for details).    Urine dipstick positive for UTI. Start antibiotics as directed. Pyridium for dysuria. Push fluids. Return precautions given.  Final Clinical Impressions(s) / UC Diagnoses   Final diagnoses:  Cystitis    ED Prescriptions    Medication Sig Dispense Auth. Provider   cephALEXin (KEFLEX) 500 MG capsule Take 1 capsule (500 mg total) by mouth 2 (two) times  daily. 10 capsule Keria Widrig V, PA-C   phenazopyridine (PYRIDIUM) 200 MG tablet Take 1 tablet (200 mg total)  by mouth 3 (three) times daily. 6 tablet Tobin Chad, [place] 05/01/19 1236

## 2019-05-01 NOTE — Discharge Instructions (Signed)
Your urine was positive for an urinary tract infection. Start keflex as directed. Pyridium for painful urination. Keep hydrated, urine should be clear to pale yellow in color. Monitor for any worsening of symptoms, fever, worsening abdominal pain, nausea/vomiting, flank pain, follow up for reevaluation.

## 2019-09-02 ENCOUNTER — Other Ambulatory Visit: Payer: Self-pay | Admitting: Family Medicine

## 2019-09-02 DIAGNOSIS — I10 Essential (primary) hypertension: Secondary | ICD-10-CM

## 2019-09-09 ENCOUNTER — Encounter: Payer: Self-pay | Admitting: Family Medicine

## 2019-09-09 ENCOUNTER — Ambulatory Visit: Payer: BC Managed Care – PPO | Admitting: Family Medicine

## 2019-09-09 ENCOUNTER — Other Ambulatory Visit: Payer: Self-pay

## 2019-09-09 VITALS — BP 130/78 | HR 76 | Ht 63.0 in | Wt 259.0 lb

## 2019-09-09 DIAGNOSIS — I10 Essential (primary) hypertension: Secondary | ICD-10-CM

## 2019-09-09 DIAGNOSIS — M7711 Lateral epicondylitis, right elbow: Secondary | ICD-10-CM

## 2019-09-09 DIAGNOSIS — Z23 Encounter for immunization: Secondary | ICD-10-CM

## 2019-09-09 DIAGNOSIS — F419 Anxiety disorder, unspecified: Secondary | ICD-10-CM | POA: Diagnosis not present

## 2019-09-09 MED ORDER — MELOXICAM 15 MG PO TABS: 15.0000 mg | ORAL_TABLET | Freq: Every day | ORAL | 1 refills | Status: DC

## 2019-09-09 MED ORDER — LISINOPRIL-HYDROCHLOROTHIAZIDE 10-12.5 MG PO TABS: 1.0000 | ORAL_TABLET | Freq: Every day | ORAL | 1 refills | Status: DC

## 2019-09-09 MED ORDER — ALPRAZOLAM 0.25 MG PO TABS: 0.2500 mg | ORAL_TABLET | Freq: Every day | ORAL | 1 refills | Status: DC | PRN

## 2019-09-09 NOTE — Patient Instructions (Addendum)
Calorie Counting for Weight Loss Calories are units of energy. Your body needs a certain amount of calories from food to keep you going throughout the day. When you eat more calories than your body needs, your body stores the extra calories as fat. When you eat fewer calories than your body needs, your body burns fat to get the energy it needs. Calorie counting means keeping track of how many calories you eat and drink each day. Calorie counting can be helpful if you need to lose weight. If you make sure to eat fewer calories than your body needs, you should lose weight. Ask your health care provider what a healthy weight is for you. For calorie counting to work, you will need to eat the right number of calories in a day in order to lose a healthy amount of weight per week. A dietitian can help you determine how many calories you need in a day and will give you suggestions on how to reach your calorie goal.  A healthy amount of weight to lose per week is usually 1-2 lb (0.5-0.9 kg). This usually means that your daily calorie intake should be reduced by 500-750 calories.  Eating 1,200 - 1,500 calories per day can help most women lose weight.  Eating 1,500 - 1,800 calories per day can help most men lose weight. What is my plan? My goal is to have __________ calories per day. If I have this many calories per day, I should lose around __________ pounds per week. What do I need to know about calorie counting? In order to meet your daily calorie goal, you will need to:  Find out how many calories are in each food you would like to eat. Try to do this before you eat.  Decide how much of the food you plan to eat.  Write down what you ate and how many calories it had. Doing this is called keeping a food log. To successfully lose weight, it is important to balance calorie counting with a healthy lifestyle that includes regular activity. Aim for 150 minutes of moderate exercise (such as walking) or 75  minutes of vigorous exercise (such as running) each week. Where do I find calorie information?  The number of calories in a food can be found on a Nutrition Facts label. If a food does not have a Nutrition Facts label, try to look up the calories online or ask your dietitian for help. Remember that calories are listed per serving. If you choose to have more than one serving of a food, you will have to multiply the calories per serving by the amount of servings you plan to eat. For example, the label on a package of bread might say that a serving size is 1 slice and that there are 90 calories in a serving. If you eat 1 slice, you will have eaten 90 calories. If you eat 2 slices, you will have eaten 180 calories. How do I keep a food log? Immediately after each meal, record the following information in your food log:  What you ate. Don't forget to include toppings, sauces, and other extras on the food.  How much you ate. This can be measured in cups, ounces, or number of items.  How many calories each food and drink had.  The total number of calories in the meal. Keep your food log near you, such as in a small notebook in your pocket, or use a mobile app or website. Some programs will calculate   calories for you and show you how many calories you have left for the day to meet your goal. What are some calorie counting tips?   Use your calories on foods and drinks that will fill you up and not leave you hungry: ? Some examples of foods that fill you up are nuts and nut butters, vegetables, lean proteins, and high-fiber foods like whole grains. High-fiber foods are foods with more than 5 g fiber per serving. ? Drinks such as sodas, specialty coffee drinks, alcohol, and juices have a lot of calories, yet do not fill you up.  Eat nutritious foods and avoid empty calories. Empty calories are calories you get from foods or beverages that do not have many vitamins or protein, such as candy, sweets, and  soda. It is better to have a nutritious high-calorie food (such as an avocado) than a food with few nutrients (such as a bag of chips).  Know how many calories are in the foods you eat most often. This will help you calculate calorie counts faster.  Pay attention to calories in drinks. Low-calorie drinks include water and unsweetened drinks.  Pay attention to nutrition labels for "low fat" or "fat free" foods. These foods sometimes have the same amount of calories or more calories than the full fat versions. They also often have added sugar, starch, or salt, to make up for flavor that was removed with the fat.  Find a way of tracking calories that works for you. Get creative. Try different apps or programs if writing down calories does not work for you. What are some portion control tips?  Know how many calories are in a serving. This will help you know how many servings of a certain food you can have.  Use a measuring cup to measure serving sizes. You could also try weighing out portions on a kitchen scale. With time, you will be able to estimate serving sizes for some foods.  Take some time to put servings of different foods on your favorite plates, bowls, and cups so you know what a serving looks like.  Try not to eat straight from a bag or box. Doing this can lead to overeating. Put the amount you would like to eat in a cup or on a plate to make sure you are eating the right portion.  Use smaller plates, glasses, and bowls to prevent overeating.  Try not to multitask (for example, watch TV or use your computer) while eating. If it is time to eat, sit down at a table and enjoy your food. This will help you to know when you are full. It will also help you to be aware of what you are eating and how much you are eating. What are tips for following this plan? Reading food labels  Check the calorie count compared to the serving size. The serving size may be smaller than what you are used to  eating.  Check the source of the calories. Make sure the food you are eating is high in vitamins and protein and low in saturated and trans fats. Shopping  Read nutrition labels while you shop. This will help you make healthy decisions before you decide to purchase your food.  Make a grocery list and stick to it. Cooking  Try to cook your favorite foods in a healthier way. For example, try baking instead of frying.  Use low-fat dairy products. Meal planning  Use more fruits and vegetables. Half of your plate should be fruits   and vegetables.  Include lean proteins like poultry and fish. How do I count calories when eating out?  Ask for smaller portion sizes.  Consider sharing an entree and sides instead of getting your own entree.  If you get your own entree, eat only half. Ask for a box at the beginning of your meal and put the rest of your entree in it so you are not tempted to eat it.  If calories are listed on the menu, choose the lower calorie options.  Choose dishes that include vegetables, fruits, whole grains, low-fat dairy products, and lean protein.  Choose items that are boiled, broiled, grilled, or steamed. Stay away from items that are buttered, battered, fried, or served with cream sauce. Items labeled "crispy" are usually fried, unless stated otherwise.  Choose water, low-fat milk, unsweetened iced tea, or other drinks without added sugar. If you want an alcoholic beverage, choose a lower calorie option such as a glass of wine or light beer.  Ask for dressings, sauces, and syrups on the side. These are usually high in calories, so you should limit the amount you eat.  If you want a salad, choose a garden salad and ask for grilled meats. Avoid extra toppings like bacon, cheese, or fried items. Ask for the dressing on the side, or ask for olive oil and vinegar or lemon to use as dressing.  Estimate how many servings of a food you are given. For example, a serving of  cooked rice is  cup or about the size of half a baseball. Knowing serving sizes will help you be aware of how much food you are eating at restaurants. The list below tells you how big or small some common portion sizes are based on everyday objects: ? 1 oz-4 stacked dice. ? 3 oz-1 deck of cards. ? 1 tsp-1 die. ? 1 Tbsp- a ping-pong ball. ? 2 Tbsp-1 ping-pong ball. ?  cup- baseball. ? 1 cup-1 baseball. Summary  Calorie counting means keeping track of how many calories you eat and drink each day. If you eat fewer calories than your body needs, you should lose weight.  A healthy amount of weight to lose per week is usually 1-2 lb (0.5-0.9 kg). This usually means reducing your daily calorie intake by 500-750 calories.  The number of calories in a food can be found on a Nutrition Facts label. If a food does not have a Nutrition Facts label, try to look up the calories online or ask your dietitian for help.  Use your calories on foods and drinks that will fill you up, and not on foods and drinks that will leave you hungry.  Use smaller plates, glasses, and bowls to prevent overeating. This information is not intended to replace advice given to you by your health care provider. Make sure you discuss any questions you have with your health care provider. Document Released: 10/02/2005 Document Revised: 06/21/2018 Document Reviewed: 09/01/2016 Elsevier Patient Education  [place].  Tennis Elbow  Tennis elbow (lateral epicondylitis) is inflammation of tendons in your outer forearm, near your elbow. Tendons are tissues that connect muscle to bone. When you have tennis elbow, inflammation affects the tendons that you use to bend your wrist and move your hand up. Inflammation occurs in the lower part of the upper arm bone (humerus), where the tendons connect to the bone (lateral epicondyle). Tennis elbow often affects people who play tennis, but anyone may get the condition from repeatedly  extending the wrist  or turning the forearm. What are the causes? This condition is usually caused by repeatedly extending the wrist, turning the forearm, and using the hands. It can result from sports or work that requires repetitive forearm movements. In some cases, it may be caused by a sudden injury. What increases the risk? You are more likely to develop tennis elbow if you play tennis or another racket sport. You also have a higher risk if you frequently use your hands for work. Besides people who play tennis, others at greater risk include:  Musicians.  Carpenters, painters, and plumbers.  Cooks.  Cashiers.  People who work in [employer].  Architect workers.  Butchers.  People who use computers. What are the signs or symptoms? Symptoms of this condition include:  Pain and tenderness in the forearm and the outer part of the elbow. Pain may be felt only when using the arm, or it may be there all the time.  A burning feeling that starts in the elbow and spreads down the forearm.  A weak grip in the hand. How is this diagnosed? This condition may be diagnosed based on:  Your symptoms and medical history.  A physical exam.  X-rays.  MRI. How is this treated? Resting and icing your arm is often the first treatment. Your health care provider may also recommend:  Medicines to reduce pain and inflammation. These may be in the form of a pill, topical gels, or shots of a steroid medicine (cortisone).  An elbow strap to reduce stress on the area.  Physical therapy. This may include massage or exercises.  An elbow brace to restrict the movements that cause symptoms. If these treatments do not help relieve your symptoms, your health care provider may recommend surgery to remove damaged muscle and reattach healthy muscle to bone. Follow these instructions at home: Activity  Rest your elbow and wrist and avoid activities that cause symptoms, as told by your health care  provider.  Do physical therapy exercises as instructed.  If you lift an object, lift it with your palm facing up. This reduces stress on your elbow. Lifestyle  If your tennis elbow is caused by sports, check your equipment and make sure that: ? You are using it correctly. ? It is the best fit for you.  If your tennis elbow is caused by work or computer use, take frequent breaks to stretch your arm. Talk with your manager about ways to manage your condition at work. If you have a brace:  Wear the brace or strap as told by your health care provider. Remove it only as told by your health care provider.  Loosen the brace if your fingers tingle, become numb, or turn cold and blue.  Keep the brace clean.  If the brace is not waterproof, ask if you may remove it for bathing. If you must keep the brace on while bathing: ? Do not let it get wet. ? Cover it with a watertight covering when you take a bath or a shower. General instructions   If directed, put ice on the painful area: ? Put ice in a plastic bag. ? Place a towel between your skin and the bag. ? Leave the ice on for 20 minutes, 2-3 times a day.  Take over-the-counter and prescription medicines only as told by your health care provider.  Keep all follow-up visits as told by your health care provider. This is important. Contact a health care provider if:  You have pain that gets worse  or does not get better with treatment.  You have numbness or weakness in your forearm, hand, or fingers. Summary  Tennis elbow (lateral epicondylitis) is inflammation of tendons in your outer forearm, near your elbow.  Common symptoms include pain and tenderness in your forearm and the outer part of your elbow.  This condition is usually caused by repeatedly extending your wrist, turning your forearm, and using your hands.  The first treatment is often resting and icing your arm to relieve symptoms. Further treatment may include taking  medicine, getting physical therapy, wearing a brace or strap, or having surgery. This information is not intended to replace advice given to you by your health care provider. Make sure you discuss any questions you have with your health care provider. Document Released: 10/02/2005 Document Revised: 06/28/2018 Document Reviewed: 07/17/2017 Elsevier Patient Education  2020 [employer].

## 2019-09-09 NOTE — Progress Notes (Signed)
Date:  09/09/2019   Name:  Krista Harrison   DOB:  April 11, 1961   MRN:  [id number]   Chief Complaint: Hypertension, Anxiety (PHQ=1 and GAD7=1), Flu Vaccine, and Elbow Pain (R) elbow pain)  Hypertension This is a chronic problem. The current episode started more than 1 year ago. The problem is unchanged. The problem is controlled. Associated symptoms include anxiety. Pertinent negatives include no blurred vision, chest pain, headaches, malaise/fatigue, neck pain, orthopnea, palpitations, peripheral edema, PND, shortness of breath or sweats. There are no associated agents to hypertension. Risk factors for coronary artery disease include obesity. Past treatments include ACE inhibitors and diuretics. The current treatment provides moderate improvement. There are no compliance problems.  There is no history of angina, kidney disease, CAD/MI, CVA, heart failure, left ventricular hypertrophy, PVD or retinopathy. There is no history of chronic renal disease, a hypertension causing med or renovascular disease.  Anxiety Presents for follow-up visit. Symptoms include excessive worry, nervous/anxious behavior and panic. Patient reports no chest pain, compulsions, confusion, decreased concentration, depressed mood, dizziness, dry mouth, feeling of choking, hyperventilation, impotence, insomnia, irritability, malaise, muscle tension, nausea, obsessions, palpitations, restlessness, shortness of breath or suicidal ideas. Symptoms occur constantly. The severity of symptoms is mild (with panic atacks). The quality of sleep is good.    Arm Pain  The injury mechanism was repetitive motion. The pain is present in the right elbow. The quality of the pain is described as aching. The pain is moderate. The pain has been constant since the incident. Pertinent negatives include no chest pain, muscle weakness, numbness or tingling. She has tried acetaminophen and NSAIDs for the symptoms. The treatment provided moderate  relief.    Lab Results  Component Value Date   CREATININE 0.72 12/02/2018   BUN 7 12/02/2018   NA 138 12/02/2018   K 3.7 12/02/2018   CL 104 12/02/2018   CO2 25 12/02/2018   Lab Results  Component Value Date   CHOL 194 04/04/2018   HDL 66 04/04/2018   LDLCALC 108 (H) 04/04/2018   TRIG 99 04/04/2018   CHOLHDL 2.9 04/04/2018   Lab Results  Component Value Date   TSH 3.600 09/04/2018   No results found for: HGBA1C   Review of Systems  Constitutional: Negative.  Negative for chills, fatigue, fever, irritability, malaise/fatigue and unexpected weight change.  HENT: Negative for congestion, ear discharge, ear pain, rhinorrhea, sinus pressure, sneezing and sore throat.   Eyes: Negative for blurred vision, photophobia, pain, discharge, redness and itching.  Respiratory: Negative for cough, shortness of breath, wheezing and stridor.   Cardiovascular: Negative for chest pain, palpitations, orthopnea and PND.  Gastrointestinal: Negative for abdominal pain, blood in stool, constipation, diarrhea, nausea and vomiting.  Endocrine: Negative for cold intolerance, heat intolerance, polydipsia, polyphagia and polyuria.  Genitourinary: Negative for dysuria, flank pain, frequency, hematuria, impotence, menstrual problem, pelvic pain, urgency, vaginal bleeding and vaginal discharge.  Musculoskeletal: Negative for arthralgias, back pain, myalgias and neck pain.  Skin: Negative for rash.  Allergic/Immunologic: Negative for environmental allergies and food allergies.  Neurological: Negative for dizziness, tingling, weakness, light-headedness, numbness and headaches.  Hematological: Negative for adenopathy. Does not bruise/bleed easily.  Psychiatric/Behavioral: Negative for confusion, decreased concentration, dysphoric mood and suicidal ideas. The patient is nervous/anxious. The patient does not have insomnia.     Patient Active Problem List   Diagnosis Date Noted  . Pure hypercholesterolemia  04/04/2018  . Rash, skin 04/04/2018  . BRCA gene mutation negative 03/21/2017  . Essential  hypertension 09/25/2016  . Gastroesophageal reflux disease 09/25/2016  . Acute anxiety 09/25/2016    No Known Allergies  Past Surgical History:  Procedure Laterality Date  . ABDOMINAL HYSTERECTOMY  2009  . ROTATOR CUFF REPAIR Bilateral 2005, 2006    Social History   Tobacco Use  . Smoking status: Never Smoker  . Smokeless tobacco: Never Used  Substance Use Topics  . Alcohol use: Yes    Alcohol/week: 0.0 standard drinks    Comment: socially  . Drug use: No     Medication list has been reviewed and updated.  Current Meds  Medication Sig  . ALPRAZolam (XANAX) 0.25 MG tablet Take 1 tablet (0.25 mg total) by mouth daily as needed.  [name] esomeprazole (NEXIUM) 20 MG capsule Take 20 mg by mouth daily at 12 noon. otc  . lisinopril-hydrochlorothiazide (ZESTORETIC) 10-12.5 MG tablet TAKE 1 TABLET BY MOUTH EVERY DAY    PHQ 2/9 Scores 09/09/2019 11/04/2018 09/04/2018 04/04/2018  PHQ - 2 Score 0 0 6 0  PHQ- 9 Score 1 0 18 0    BP Readings from Last 3 Encounters:  09/09/19 130/78  05/01/19 (!) 171/95  12/02/18 118/69    Physical Exam Vitals signs and nursing note reviewed.  Constitutional:      General: She is not in acute distress.    Appearance: She is well-developed. She is obese. She is not diaphoretic.  HENT:     Head: Normocephalic and atraumatic.     Right Ear: External ear normal.     Left Ear: External ear normal.     Nose: Nose normal.  Eyes:     General: Lids are everted, no foreign bodies appreciated. No scleral icterus.       Right eye: No discharge.        Left eye: No foreign body, discharge or hordeolum.     Conjunctiva/sclera: Conjunctivae normal.     Right eye: Right conjunctiva is not injected.     Left eye: Left conjunctiva is not injected.     Pupils: Pupils are equal, round, and reactive to light.  Neck:     Musculoskeletal: Normal range of motion and neck  supple.     Thyroid: No thyromegaly.     Vascular: No JVD.     Trachea: No tracheal deviation.  Cardiovascular:     Rate and Rhythm: Normal rate and regular rhythm.     Heart sounds: Normal heart sounds. No murmur. No friction rub. No gallop.   Pulmonary:     Effort: Pulmonary effort is normal. No respiratory distress.     Breath sounds: Normal breath sounds. No wheezing or rales.  Abdominal:     General: Bowel sounds are normal.     Palpations: Abdomen is soft. There is no mass.     Tenderness: There is no abdominal tenderness. There is no guarding or rebound.  Musculoskeletal: Normal range of motion.        General: No tenderness.  Lymphadenopathy:     Cervical: No cervical adenopathy.  Skin:    General: Skin is warm and dry.     Findings: No rash.  Neurological:     Mental Status: She is alert and oriented to person, place, and time.     Cranial Nerves: No cranial nerve deficit.     Deep Tendon Reflexes: Reflexes are normal and symmetric. Reflexes normal.  Psychiatric:        Mood and Affect: Mood is not anxious or depressed.  Wt Readings from Last 3 Encounters:  09/09/19 259 lb (117.5 kg)  12/02/18 222 lb (100.7 kg)  11/27/18 222 lb (100.7 kg)    BP 130/78   Pulse 76   Ht '5\' 3"'  (1.6 m)   Wt 259 lb (117.5 kg)   BMI 45.88 kg/m   Assessment and Plan:   1. Acute anxiety Chronic baseline anxiety is controlled but patient still has acute panic attacks on a episodic nature.  This is controlled with 0.25 Xanax on an as-needed basis with 30 usually lasting her 6 months. - ALPRAZolam (XANAX) 0.25 MG tablet; Take 1 tablet (0.25 mg total) by mouth daily as needed.  Dispense: 30 tablet; Refill: 1  2. Essential hypertension Chronic.  Controlled.  Continue lisinopril hydrochlorothiazide 10-12 0.5 once a day.  Will check renal function panel to evaluate GFR.  Will recheck in 6 months. - lisinopril-hydrochlorothiazide (ZESTORETIC) 10-12.5 MG tablet; Take 1 tablet by mouth  daily.  Dispense: 90 tablet; Refill: 1 - Renal Function Panel  3. Lateral epicondylitis of right elbow New problem onset a  lateral epicondylitis due to excess computer work.  Patient has not had results on Tylenol and OTC NSAIDs.  Will initiate meloxicam 15 mg once a day and icing of the area with strap.  Will recheck on an as-needed basis  4. Influenza vaccine needed Discussed and administered - Flu Vaccine QUAD 6+ mos PF IM (Fluarix Quad PF)

## 2019-09-10 LAB — RENAL FUNCTION PANEL
Albumin: 4.7 g/dL (ref 3.8–4.9)
BUN/Creatinine Ratio: 26 — ABNORMAL HIGH (ref 9–23)
BUN: 20 mg/dL (ref 6–24)
CO2: 22 mmol/L (ref 20–29)
Calcium: 10.1 mg/dL (ref 8.7–10.2)
Chloride: 101 mmol/L (ref 96–106)
Creatinine, Ser: 0.76 mg/dL (ref 0.57–1.00)
GFR calc Af Amer: 101 mL/min/{1.73_m2} (ref >59)
GFR calc non Af Amer: 87 mL/min/{1.73_m2} (ref >59)
Glucose: 113 mg/dL — ABNORMAL HIGH (ref 65–99)
Phosphorus: 3.7 mg/dL (ref 3.0–4.3)
Potassium: 4.6 mmol/L (ref 3.5–5.2)
Sodium: 141 mmol/L (ref 134–144)

## 2020-01-16 ENCOUNTER — Other Ambulatory Visit: Payer: Self-pay | Admitting: Family Medicine

## 2020-03-03 ENCOUNTER — Ambulatory Visit: Payer: BC Managed Care – PPO | Admitting: Family Medicine

## 2020-03-18 ENCOUNTER — Ambulatory Visit: Payer: Self-pay | Admitting: Family Medicine

## 2020-03-23 ENCOUNTER — Other Ambulatory Visit: Payer: Self-pay

## 2020-03-23 ENCOUNTER — Telehealth: Payer: Self-pay

## 2020-03-23 NOTE — Telephone Encounter (Signed)
Called pt and left a message concerning FIT test/ colonoscopy

## 2020-03-29 ENCOUNTER — Encounter: Payer: Self-pay | Admitting: Family Medicine

## 2020-04-20 ENCOUNTER — Other Ambulatory Visit: Payer: Self-pay | Admitting: Family Medicine

## 2020-04-20 DIAGNOSIS — I10 Essential (primary) hypertension: Secondary | ICD-10-CM

## 2020-04-20 NOTE — Telephone Encounter (Signed)
Requested Prescriptions  Pending Prescriptions Disp Refills  . lisinopril-hydrochlorothiazide (ZESTORETIC) 10-12.5 MG tablet [Pharmacy Med Name: LISINOPRIL-HCTZ 10-12.5 MG TAB] 30 tablet 0    Sig: TAKE 1 TABLET BY MOUTH EVERY DAY     Cardiovascular:  ACEI + Diuretic Combos Failed - 04/20/2020 11:39 AM      Failed - Na in normal range and within 180 days    Sodium  Date Value Ref Range Status  09/09/2019 141 134 - 144 mmol/L Final         Failed - K in normal range and within 180 days    Potassium  Date Value Ref Range Status  09/09/2019 4.6 3.5 - 5.2 mmol/L Final         Failed - Cr in normal range and within 180 days    Creatinine, Ser  Date Value Ref Range Status  09/09/2019 0.76 0.57 - 1.00 mg/dL Final         Failed - Ca in normal range and within 180 days    Calcium  Date Value Ref Range Status  09/09/2019 10.1 8.7 - 10.2 mg/dL Final         Failed - Valid encounter within last 6 months    Recent Outpatient Visits          7 months ago Essential hypertension   [place], Deanna C, MD   1 year ago Cough   [place], Deanna C, MD   1 year ago Current mild episode of major depressive disorder, unspecified whether recurrent [facility]   [facility] Juline Patch, MD   1 year ago Essential hypertension   [place], Deanna C, MD   2 years ago Essential hypertension   [facility] Juline Patch, MD             Passed - Patient is not pregnant      Passed - Last BP in normal range    BP Readings from Last 1 Encounters:  09/09/19 130/78         Message was previously left for pt. to call and schedule office visit.

## 2020-04-20 NOTE — Telephone Encounter (Signed)
Patient called, left VM to call back to schedule f/u appointment. Noted last OV to f/u in 6 months.

## 2020-05-25 ENCOUNTER — Other Ambulatory Visit: Payer: Self-pay | Admitting: Family Medicine

## 2020-05-25 ENCOUNTER — Encounter: Payer: Self-pay | Admitting: Family Medicine

## 2020-05-25 DIAGNOSIS — I10 Essential (primary) hypertension: Secondary | ICD-10-CM

## 2020-05-25 NOTE — Telephone Encounter (Signed)
Requested Prescriptions  Pending Prescriptions Disp Refills  . lisinopril-hydrochlorothiazide (ZESTORETIC) 10-12.5 MG tablet [Pharmacy Med Name: LISINOPRIL-HCTZ 10-12.5 MG TAB] 7 tablet 0    Sig: TAKE 1 TABLET BY MOUTH EVERY DAY     Cardiovascular:  ACEI + Diuretic Combos Failed - 05/25/2020 11:31 AM      Failed - Na in normal range and within 180 days    Sodium  Date Value Ref Range Status  09/09/2019 141 134 - 144 mmol/L Final         Failed - K in normal range and within 180 days    Potassium  Date Value Ref Range Status  09/09/2019 4.6 3.5 - 5.2 mmol/L Final         Failed - Cr in normal range and within 180 days    Creatinine, Ser  Date Value Ref Range Status  09/09/2019 0.76 0.57 - 1.00 mg/dL Final         Failed - Ca in normal range and within 180 days    Calcium  Date Value Ref Range Status  09/09/2019 10.1 8.7 - 10.2 mg/dL Final         Failed - Valid encounter within last 6 months    Recent Outpatient Visits          8 months ago Essential hypertension   [place], Deanna C, MD   1 year ago Cough   [facility] Juline Patch, MD   1 year ago Current mild episode of major depressive disorder, unspecified whether recurrent ([place])   [facility] Juline Patch, MD   1 year ago Essential hypertension   [place], Deanna C, MD   2 years ago Essential hypertension   [place], MD      Future Appointments            In 6 days Juline Patch, MD [facility] [place] - Patient is not pregnant      Passed - Last BP in normal range    BP Readings from Last 1 Encounters:  09/09/19 130/78

## 2020-05-29 ENCOUNTER — Other Ambulatory Visit: Payer: Self-pay | Admitting: Family Medicine

## 2020-05-29 NOTE — Telephone Encounter (Signed)
Requested Prescriptions  Pending Prescriptions Disp Refills  . meloxicam (MOBIC) 15 MG tablet [Pharmacy Med Name: MELOXICAM 15 MG TABLET] 30 tablet 0    Sig: TAKE 1 TABLET BY MOUTH EVERY DAY     Analgesics:  COX2 Inhibitors Failed - 05/29/2020  9:12 AM      Failed - HGB in normal range and within 360 days    Hemoglobin  Date Value Ref Range Status  12/02/2018 14.7 12.0 - 15.0 g/dL Final         Passed - Cr in normal range and within 360 days    Creatinine, Ser  Date Value Ref Range Status  09/09/2019 0.76 0.57 - 1.00 mg/dL Final         Passed - Patient is not pregnant      Passed - Valid encounter within last 12 months    Recent Outpatient Visits          8 months ago Essential hypertension   [place], Deanna C, MD   1 year ago Cough   [place], MD   1 year ago Current mild episode of major depressive disorder, unspecified whether recurrent [facility])   [facility] Juline Patch, MD   1 year ago Essential hypertension   [place], Deanna C, MD   2 years ago Essential hypertension   [place], MD      Future Appointments            In 2 days Juline Patch, MD [facility], [facility]

## 2020-05-31 ENCOUNTER — Other Ambulatory Visit: Payer: Self-pay

## 2020-05-31 ENCOUNTER — Ambulatory Visit (INDEPENDENT_AMBULATORY_CARE_PROVIDER_SITE_OTHER): Payer: BC Managed Care – PPO | Admitting: Family Medicine

## 2020-05-31 ENCOUNTER — Encounter: Payer: Self-pay | Admitting: Family Medicine

## 2020-05-31 VITALS — BP 128/76 | HR 80 | Ht 63.0 in | Wt 261.0 lb

## 2020-05-31 DIAGNOSIS — Z0000 Encounter for general adult medical examination without abnormal findings: Secondary | ICD-10-CM | POA: Diagnosis not present

## 2020-05-31 DIAGNOSIS — E7800 Pure hypercholesterolemia, unspecified: Secondary | ICD-10-CM | POA: Diagnosis not present

## 2020-05-31 DIAGNOSIS — I10 Essential (primary) hypertension: Secondary | ICD-10-CM | POA: Diagnosis not present

## 2020-05-31 DIAGNOSIS — Z1211 Encounter for screening for malignant neoplasm of colon: Secondary | ICD-10-CM

## 2020-05-31 DIAGNOSIS — F419 Anxiety disorder, unspecified: Secondary | ICD-10-CM | POA: Diagnosis not present

## 2020-05-31 DIAGNOSIS — Z1231 Encounter for screening mammogram for malignant neoplasm of breast: Secondary | ICD-10-CM

## 2020-05-31 MED ORDER — ALPRAZOLAM 0.25 MG PO TABS: 0.2500 mg | ORAL_TABLET | Freq: Every day | ORAL | 1 refills | Status: DC | PRN

## 2020-05-31 MED ORDER — LISINOPRIL-HYDROCHLOROTHIAZIDE 10-12.5 MG PO TABS: 1.0000 | ORAL_TABLET | Freq: Every day | ORAL | 1 refills | Status: DC

## 2020-05-31 NOTE — Progress Notes (Signed)
Date:  05/31/2020   Name:  Krista Harrison   DOB:  [birth year]   MRN:  [id number]   Chief Complaint: Annual Exam (No longer see's GYN. Next pap will be 2 years. Breast Exam. Mammogram. Annual Fit Screening)  Patient is a 59 year old female who presents for a comprehensive physical exam. The patient reports the following problems: none. Health maintenance has been reviewed colon cancer screen/ mammogram. Anxiety Presents for follow-up visit. Patient reports no chest pain, compulsions, confusion, decreased concentration, depressed mood, dizziness, dry mouth, excessive worry, feeling of choking, hyperventilation, impotence, irritability, malaise, muscle tension, nausea, nervous/anxious behavior, palpitations, panic or shortness of breath.    Hyperlipidemia This is a chronic problem. The current episode started more than 1 year ago. The problem is controlled. Recent lipid tests were reviewed and are normal. She has no history of chronic renal disease, diabetes, hypothyroidism, liver disease or obesity. Pertinent negatives include no chest pain, myalgias or shortness of breath.  Hypertension This is a chronic problem. The current episode started more than 1 year ago. The problem has been waxing and waning since onset. The problem is controlled. Associated symptoms include anxiety. Pertinent negatives include no blurred vision, chest pain, headaches, malaise/fatigue, neck pain, orthopnea, palpitations, peripheral edema, PND or shortness of breath. Past treatments include ACE inhibitors and diuretics. The current treatment provides moderate improvement. There is no history of chronic renal disease.    Lab Results  Component Value Date   CREATININE 0.76 09/09/2019   BUN 20 09/09/2019   NA 141 09/09/2019   K 4.6 09/09/2019   CL 101 09/09/2019   CO2 22 09/09/2019   Lab Results  Component Value Date   CHOL 194 04/04/2018   HDL 66 04/04/2018   LDLCALC 108 (H) 04/04/2018   TRIG 99 04/04/2018     CHOLHDL 2.9 04/04/2018   Lab Results  Component Value Date   TSH 3.600 09/04/2018   No results found for: HGBA1C Lab Results  Component Value Date   WBC 10.9 (H) 12/02/2018   HGB 14.7 12/02/2018   HCT 44.6 12/02/2018   MCV 93.7 12/02/2018   PLT 303 12/02/2018   No results found for: ALT, AST, GGT, ALKPHOS, BILITOT   Review of Systems  Constitutional: Negative.  Negative for chills, fatigue, fever, irritability, malaise/fatigue and unexpected weight change.  HENT: Negative for congestion, ear discharge, ear pain, rhinorrhea, sinus pressure, sneezing and sore throat.   Eyes: Negative for blurred vision, photophobia, pain, discharge, redness and itching.  Respiratory: Negative for cough, shortness of breath, wheezing and stridor.   Cardiovascular: Negative for chest pain, palpitations, orthopnea, leg swelling and PND.  Gastrointestinal: Negative for abdominal pain, blood in stool, constipation, diarrhea, nausea and vomiting.  Endocrine: Negative for cold intolerance, heat intolerance, polydipsia, polyphagia and polyuria.  Genitourinary: Negative for dysuria, flank pain, frequency, hematuria, impotence, menstrual problem, pelvic pain, urgency, vaginal bleeding and vaginal discharge.  Musculoskeletal: Negative for arthralgias, back pain, myalgias and neck pain.  Skin: Negative for rash.  Allergic/Immunologic: Negative for environmental allergies and food allergies.  Neurological: Negative for dizziness, weakness, light-headedness, numbness and headaches.  Hematological: Negative for adenopathy. Does not bruise/bleed easily.  Psychiatric/Behavioral: Negative for confusion, decreased concentration and dysphoric mood. The patient is not nervous/anxious.     Patient Active Problem List   Diagnosis Date Noted  . Pure hypercholesterolemia 04/04/2018  . Rash, skin 04/04/2018  . BRCA gene mutation negative 03/21/2017  . Essential hypertension 09/25/2016  . Gastroesophageal reflux  disease 09/25/2016  . Acute anxiety 09/25/2016    No Known Allergies  Past Surgical History:  Procedure Laterality Date  . ABDOMINAL HYSTERECTOMY  2009  . ROTATOR CUFF REPAIR Bilateral 2005, 2006    Social History   Tobacco Use  . Smoking status: Never Smoker  . Smokeless tobacco: Never Used  Vaping Use  . Vaping Use: Never used  Substance Use Topics  . Alcohol use: Yes    Alcohol/week: 0.0 standard drinks    Comment: socially  . Drug use: No     Medication list has been reviewed and updated.  Current Meds  Medication Sig  . ALPRAZolam (XANAX) 0.25 MG tablet Take 1 tablet (0.25 mg total) by mouth daily as needed.  [name] esomeprazole (NEXIUM) 20 MG capsule Take 20 mg by mouth daily at 12 noon. otc  . lisinopril-hydrochlorothiazide (ZESTORETIC) 10-12.5 MG tablet TAKE 1 TABLET BY MOUTH EVERY DAY  . meloxicam (MOBIC) 15 MG tablet TAKE 1 TABLET BY MOUTH EVERY DAY    PHQ 2/9 Scores 05/31/2020 09/09/2019 11/04/2018 09/04/2018  PHQ - 2 Score 0 0 0 6  PHQ- 9 Score 0 1 0 18    GAD 7 : Generalized Anxiety Score 05/31/2020 09/09/2019  Nervous, Anxious, on Edge 1 0  Control/stop worrying 1 0  Worry too much - different things 1 0  Trouble relaxing 0 0  Restless 0 0  Easily annoyed or irritable 0 0  Afraid - awful might happen 0 1  Total GAD 7 Score 3 1  Anxiety Difficulty Not difficult at all -    BP Readings from Last 3 Encounters:  05/31/20 128/76  09/09/19 130/78  05/01/19 (!) 171/95    Physical Exam Vitals and nursing note reviewed.  Constitutional:      Appearance: Normal appearance. She is well-developed and well-groomed. She is obese.  HENT:     Head: Normocephalic.     Jaw: There is normal jaw occlusion.     Right Ear: Hearing, tympanic membrane, ear canal and external ear normal. There is no impacted cerumen.     Left Ear: Hearing, tympanic membrane, ear canal and external ear normal. There is no impacted cerumen.     Nose: Nose normal. No congestion or  rhinorrhea.     Mouth/Throat:     Lips: Pink.     Mouth: Mucous membranes are moist.     Dentition: Normal dentition.  Eyes:     General: Lids are normal. Lids are everted, no foreign bodies appreciated. Gaze aligned appropriately. No scleral icterus.       Left eye: No foreign body or hordeolum.     Conjunctiva/sclera: Conjunctivae normal.     Right eye: Right conjunctiva is not injected.     Left eye: Left conjunctiva is not injected.     Pupils: Pupils are equal, round, and reactive to light.     Funduscopic exam:    Right eye: Red reflex present.        Left eye: Red reflex present. Neck:     Thyroid: No thyroid mass, thyromegaly or thyroid tenderness.     Vascular: Normal carotid pulses. No carotid bruit, hepatojugular reflux or JVD.     Trachea: Trachea normal. No tracheal deviation.  Cardiovascular:     Rate and Rhythm: Normal rate and regular rhythm.     Chest Wall: PMI is not displaced.     Pulses: Normal pulses.          Carotid pulses are 2+ on  the right side and 2+ on the left side.      Radial pulses are 2+ on the right side and 2+ on the left side.       Femoral pulses are 2+ on the right side and 2+ on the left side.      Popliteal pulses are 2+ on the right side and 2+ on the left side.       Dorsalis pedis pulses are 2+ on the right side and 2+ on the left side.       Posterior tibial pulses are 2+ on the right side and 2+ on the left side.     Heart sounds: Normal heart sounds, S1 normal and S2 normal. No murmur heard.  No systolic murmur is present.  No diastolic murmur is present.  No friction rub. No gallop. No S3 or S4 sounds.   Pulmonary:     Effort: Pulmonary effort is normal. No respiratory distress.     Breath sounds: Normal breath sounds. No decreased breath sounds, wheezing, rhonchi or rales.  Chest:     Chest wall: No mass.     Breasts:        Right: Normal. No swelling, bleeding, inverted nipple, mass, nipple discharge, skin change or tenderness.          Left: Normal. No swelling, bleeding, inverted nipple, mass, nipple discharge, skin change or tenderness.  Abdominal:     General: Bowel sounds are normal.     Palpations: Abdomen is soft. There is no hepatomegaly, splenomegaly or mass.     Tenderness: There is no abdominal tenderness. There is no guarding or rebound.  Genitourinary:    Pubic Area: No rash.      Vagina: Normal. No tenderness.     Rectum: Normal. Guaiac result negative. No mass.  Musculoskeletal:        General: No tenderness. Normal range of motion.     Cervical back: Full passive range of motion without pain, normal range of motion and neck supple.     Right lower leg: No edema.     Left lower leg: No edema.  Lymphadenopathy:     Head:     Right side of head: No submental or submandibular adenopathy.     Left side of head: No submental or submandibular adenopathy.     Cervical: No cervical adenopathy.     Right cervical: No superficial, deep or posterior cervical adenopathy.    Left cervical: No superficial, deep or posterior cervical adenopathy.     Upper Body:     Right upper body: No supraclavicular or axillary adenopathy.     Left upper body: No supraclavicular or axillary adenopathy.  Skin:    General: Skin is warm and moist.     Findings: No bruising, lesion or rash.  Neurological:     Mental Status: She is alert and oriented to person, place, and time.     Cranial Nerves: No cranial nerve deficit.     Sensory: Sensation is intact.     Motor: Motor function is intact.     Deep Tendon Reflexes: Reflexes normal.  Psychiatric:        Attention and Perception: Attention normal.        Mood and Affect: Mood and affect normal. Mood is not depressed.        Behavior: Behavior is cooperative.     Wt Readings from Last 3 Encounters:  05/31/20 261 lb (118.4 kg)  09/09/19 259 lb (117.5  kg)  12/02/18 222 lb (100.7 kg)    BP 128/76   Pulse 80   Ht '5\' 3"'  (1.6 m)   Wt 261 lb (118.4 kg)   SpO2 97%    BMI 46.23 kg/m   Assessment and Plan: 1. Annual physical exam No subjective/objective concerns noted during history and physical exam.  Patient's chart was reviewed for previous encounters, most recent labs, most recent imaging, and care everywhere.LAVANA HUCKEBA is a 59 y.o. female who presents today for her Complete Annual Exam. She feels well. She reports exercisingwhen able. She reports she is sleeping fairly well. Immunizations are reviewed and recommendations provided.   Age appropriate screening tests are discussed. Counseling given for risk factor reduction interventions.  We will obtain a renal function panel lipid panel and CBC. - Renal Function Panel - Lipid Panel With LDL/HDL Ratio - CBC with Differential/Platelet  2. Acute anxiety Chronic.  Controlled.  Stable.  Gad score 3.  Patient does well with an occasional Xanax 0.25 as needed for panic circumstances. - ALPRAZolam (XANAX) 0.25 MG tablet; Take 1 tablet (0.25 mg total) by mouth daily as needed.  Dispense: 30 tablet; Refill: 1  3. Pure hypercholesterolemia Chronic.  Controlled.  Stable.  Discussion of weight with patient have an awareness of watching her cholesterol and triglycerides.  Will check lipid panel - Lipid Panel With LDL/HDL Ratio  4. Essential hypertension Chronic.  Controlled.  Stable.  Continue lisinopril hydrochlorothiazide 10-12 0.5 once a day.  Will check renal function panel - lisinopril-hydrochlorothiazide (ZESTORETIC) 10-12.5 MG tablet; Take 1 tablet by mouth daily.  Dispense: 90 tablet; Refill: 1 - Renal Function Panel  5. Colon cancer screening Gust with patient and she is agreed to do the FIT.  Direct rectal examination was unremarkable for mass or guaiac positive.- Fecal occult blood, imunochemical  6. Encounter for screening mammogram for malignant neoplasm of breast Discussed them mammogram was arranged. - MM 3D SCREEN BREAST BILATERAL; Future

## 2020-06-01 LAB — CBC WITH DIFFERENTIAL/PLATELET
Basophils Absolute: 0.1 10*3/uL (ref 0.0–0.2)
Basos: 1 %
EOS (ABSOLUTE): 0.2 10*3/uL (ref 0.0–0.4)
Eos: 2 %
Hematocrit: 43.2 % (ref 34.0–46.6)
Hemoglobin: 14.5 g/dL (ref 11.1–15.9)
Immature Grans (Abs): 0 10*3/uL (ref 0.0–0.1)
Immature Granulocytes: 0 %
Lymphocytes Absolute: 2.2 10*3/uL (ref 0.7–3.1)
Lymphs: 30 %
MCH: 30.9 pg (ref 26.6–33.0)
MCHC: 33.6 g/dL (ref 31.5–35.7)
MCV: 92 fL (ref 79–97)
Monocytes Absolute: 0.6 10*3/uL (ref 0.1–0.9)
Monocytes: 8 %
Neutrophils Absolute: 4.4 10*3/uL (ref 1.4–7.0)
Neutrophils: 59 %
Platelets: 310 10*3/uL (ref 150–450)
RBC: 4.7 x10E6/uL (ref 3.77–5.28)
RDW: 13 % (ref 11.7–15.4)
WBC: 7.4 10*3/uL (ref 3.4–10.8)

## 2020-06-01 LAB — RENAL FUNCTION PANEL
Albumin: 4.9 g/dL (ref 3.8–4.9)
BUN/Creatinine Ratio: 26 — ABNORMAL HIGH (ref 9–23)
BUN: 21 mg/dL (ref 6–24)
CO2: 25 mmol/L (ref 20–29)
Calcium: 9.5 mg/dL (ref 8.7–10.2)
Chloride: 101 mmol/L (ref 96–106)
Creatinine, Ser: 0.81 mg/dL (ref 0.57–1.00)
GFR calc Af Amer: 93 mL/min/{1.73_m2} (ref >59)
GFR calc non Af Amer: 80 mL/min/{1.73_m2} (ref >59)
Glucose: 120 mg/dL — ABNORMAL HIGH (ref 65–99)
Phosphorus: 3.6 mg/dL (ref 3.0–4.3)
Potassium: 5 mmol/L (ref 3.5–5.2)
Sodium: 139 mmol/L (ref 134–144)

## 2020-06-01 LAB — LIPID PANEL WITH LDL/HDL RATIO
Cholesterol, Total: 194 mg/dL (ref 100–199)
HDL: 58 mg/dL (ref >39)
LDL Chol Calc (NIH): 120 mg/dL — ABNORMAL HIGH (ref 0–99)
LDL/HDL Ratio: 2.1 ratio (ref 0.0–3.2)
Triglycerides: 89 mg/dL (ref 0–149)
VLDL Cholesterol Cal: 16 mg/dL (ref 5–40)

## 2020-06-15 ENCOUNTER — Telehealth: Payer: Self-pay

## 2020-06-15 NOTE — Telephone Encounter (Signed)
Called pt with mammo appt on 06/24/20 @ 7:40 am in [place]

## 2020-06-23 ENCOUNTER — Other Ambulatory Visit: Payer: Self-pay | Admitting: Family Medicine

## 2020-06-23 NOTE — Telephone Encounter (Signed)
Requested Prescriptions  Pending Prescriptions Disp Refills  . meloxicam (MOBIC) 15 MG tablet [Pharmacy Med Name: MELOXICAM 15 MG TABLET] 30 tablet 3    Sig: TAKE 1 TABLET BY MOUTH EVERY DAY     Analgesics:  COX2 Inhibitors Passed - 06/23/2020  1:28 AM      Passed - HGB in normal range and within 360 days    Hemoglobin  Date Value Ref Range Status  05/31/2020 14.5 11.1 - 15.9 g/dL Final         Passed - Cr in normal range and within 360 days    Creatinine, Ser  Date Value Ref Range Status  05/31/2020 0.81 0.57 - 1.00 mg/dL Final         Passed - Patient is not pregnant      Passed - Valid encounter within last 12 months    Recent Outpatient Visits          3 weeks ago Annual physical exam   [facility] Juline Patch, MD   9 months ago Essential hypertension   [place], Deanna C, MD   1 year ago Cough   [place], Deanna C, MD   1 year ago Current mild episode of major depressive disorder, unspecified whether recurrent [facility])   [facility] Juline Patch, MD   1 year ago Essential hypertension   [place], [place]      Future Appointments            In 11 months Juline Patch, MD [facility], [facility]

## 2020-06-24 ENCOUNTER — Ambulatory Visit
Admission: RE | Admit: 2020-06-24 | Discharge: 2020-06-24 | Disposition: A | Discharge status: Home / Self Care | Payer: BC Managed Care – PPO | Source: Ambulatory Visit | Attending: Family Medicine | Admitting: Family Medicine

## 2020-06-24 ENCOUNTER — Other Ambulatory Visit: Payer: Self-pay

## 2020-06-24 DIAGNOSIS — Z1231 Encounter for screening mammogram for malignant neoplasm of breast: Secondary | ICD-10-CM

## 2020-07-15 DIAGNOSIS — R070 Pain in throat: Secondary | ICD-10-CM | POA: Diagnosis not present

## 2020-10-25 ENCOUNTER — Other Ambulatory Visit: Payer: Self-pay

## 2020-10-25 ENCOUNTER — Ambulatory Visit (INDEPENDENT_AMBULATORY_CARE_PROVIDER_SITE_OTHER): Payer: BC Managed Care – PPO | Admitting: Bariatrics

## 2020-10-25 ENCOUNTER — Encounter (INDEPENDENT_AMBULATORY_CARE_PROVIDER_SITE_OTHER): Payer: Self-pay | Admitting: Bariatrics

## 2020-10-25 VITALS — BP 139/83 | HR 92 | Temp 98.3°F | Ht 61.0 in | Wt 262.0 lb

## 2020-10-25 DIAGNOSIS — I10 Essential (primary) hypertension: Secondary | ICD-10-CM

## 2020-10-25 DIAGNOSIS — Z6841 Body Mass Index (BMI) 40.0 and over, adult: Secondary | ICD-10-CM

## 2020-10-25 DIAGNOSIS — R7309 Other abnormal glucose: Secondary | ICD-10-CM

## 2020-10-25 DIAGNOSIS — R0602 Shortness of breath: Secondary | ICD-10-CM | POA: Diagnosis not present

## 2020-10-25 DIAGNOSIS — E559 Vitamin D deficiency, unspecified: Secondary | ICD-10-CM

## 2020-10-25 DIAGNOSIS — K219 Gastro-esophageal reflux disease without esophagitis: Secondary | ICD-10-CM

## 2020-10-25 DIAGNOSIS — R5383 Other fatigue: Secondary | ICD-10-CM

## 2020-10-25 DIAGNOSIS — Z9189 Other specified personal risk factors, not elsewhere classified: Secondary | ICD-10-CM

## 2020-10-25 DIAGNOSIS — Z1331 Encounter for screening for depression: Secondary | ICD-10-CM

## 2020-10-25 DIAGNOSIS — Z0289 Encounter for other administrative examinations: Secondary | ICD-10-CM

## 2020-10-25 DIAGNOSIS — E66813 Obesity, class 3: Secondary | ICD-10-CM

## 2020-10-26 ENCOUNTER — Encounter (INDEPENDENT_AMBULATORY_CARE_PROVIDER_SITE_OTHER): Payer: Self-pay | Admitting: Bariatrics

## 2020-10-26 DIAGNOSIS — R7303 Prediabetes: Secondary | ICD-10-CM | POA: Insufficient documentation

## 2020-10-26 DIAGNOSIS — E559 Vitamin D deficiency, unspecified: Secondary | ICD-10-CM | POA: Insufficient documentation

## 2020-10-26 LAB — COMPREHENSIVE METABOLIC PANEL
ALT: 18 IU/L (ref 0–32)
AST: 13 IU/L (ref 0–40)
Albumin/Globulin Ratio: 1.7 (ref 1.2–2.2)
Albumin: 4.7 g/dL (ref 3.8–4.9)
Alkaline Phosphatase: 98 IU/L (ref 44–121)
BUN/Creatinine Ratio: 18 (ref 9–23)
BUN: 14 mg/dL (ref 6–24)
Bilirubin Total: 0.2 mg/dL (ref 0.0–1.2)
CO2: 25 mmol/L (ref 20–29)
Calcium: 9.4 mg/dL (ref 8.7–10.2)
Chloride: 99 mmol/L (ref 96–106)
Creatinine, Ser: 0.79 mg/dL (ref 0.57–1.00)
GFR calc Af Amer: 95 mL/min/{1.73_m2} (ref >59)
GFR calc non Af Amer: 82 mL/min/{1.73_m2} (ref >59)
Globulin, Total: 2.7 g/dL (ref 1.5–4.5)
Glucose: 112 mg/dL — ABNORMAL HIGH (ref 65–99)
Potassium: 4.4 mmol/L (ref 3.5–5.2)
Sodium: 139 mmol/L (ref 134–144)
Total Protein: 7.4 g/dL (ref 6.0–8.5)

## 2020-10-26 LAB — T3: T3, Total: 135 ng/dL (ref 71–180)

## 2020-10-26 LAB — TSH: TSH: 2.97 u[IU]/mL (ref 0.450–4.500)

## 2020-10-26 LAB — HEMOGLOBIN A1C
Est. average glucose Bld gHb Est-mCnc: 137 mg/dL
Hgb A1c MFr Bld: 6.4 % — ABNORMAL HIGH (ref 4.8–5.6)

## 2020-10-26 LAB — INSULIN, RANDOM: INSULIN: 19.2 u[IU]/mL (ref 2.6–24.9)

## 2020-10-26 LAB — T4, FREE: Free T4: 1.05 ng/dL (ref 0.82–1.77)

## 2020-10-26 LAB — VITAMIN D 25 HYDROXY (VIT D DEFICIENCY, FRACTURES): Vit D, 25-Hydroxy: 27 ng/mL — ABNORMAL LOW (ref 30.0–100.0)

## 2020-10-27 NOTE — Progress Notes (Signed)
`     Chief Complaint:   OBESITY Krista Harrison (MR# [id number]) is a 60 y.o. female who presents for evaluation and treatment of obesity and related comorbidities. Current BMI is Body mass index is 49.5 kg/m. Krista Harrison has been struggling with her weight for many years and has been unsuccessful in either losing weight, maintaining weight loss, or reaching her healthy weight goal.  Krista Harrison is currently in the action stage of change and ready to dedicate time achieving and maintaining a healthier weight. Krista Harrison is interested in becoming our patient and working on intensive lifestyle modifications including (but not limited to) diet and exercise for weight loss.  Krista Harrison likes to cook, but notes time as an obstacle.  She has more cravings in the evenings.  She sometimes skips meals.  Krista Harrison's habits were reviewed today and are as follows: Her family eats meals together, she thinks her family will eat healthier with her, her desired weight loss is 115 pounds, she has been heavy most of her life, her heaviest weight ever was her current weight, she skips meals sometimes, she frequently makes poor food choices, she has problems with excessive hunger, she frequently eats larger portions than normal and she struggles with emotional eating.  Depression Screen Krista Harrison's Food and Mood (modified PHQ-9) score was 9.  Depression screen PHQ 2/9 10/25/2020  Decreased Interest 2  Down, Depressed, Hopeless 1  PHQ - 2 Score 3  Altered sleeping 1  Tired, decreased energy 1  Change in appetite 1  Feeling bad or failure about yourself  1  Trouble concentrating 1  Moving slowly or fidgety/restless 1  Suicidal thoughts 0  PHQ-9 Score 9  Difficult doing work/chores -   Subjective:   1. Other fatigue Krista Harrison admits to daytime somnolence and admits to waking up still tired. Patent has a history of symptoms of daytime fatigue, morning fatigue and snoring. Krista Harrison generally gets 6 hours of sleep per night, and states that  she has generally restful sleep. Snoring is present. Apneic episodes are not present. Epworth Sleepiness Score is 8.    2. SOB (shortness of breath) on exertion Dlynn notes increasing shortness of breath with exercising and seems to be worsening over time with weight gain. She notes getting out of breath sooner with activity than she used to. This has gotten worse recently. Krista Harrison denies shortness of breath at rest or orthopnea.  3. Essential hypertension Review: taking medications as instructed, no medication side effects noted, no chest pain on exertion, no dyspnea on exertion, no swelling of ankles.  She is taking lisinopril-HCTZ 10-12.5 mg daily.  BP Readings from Last 3 Encounters:  10/25/20 139/83  05/31/20 128/76  09/09/19 130/78   4. Gastroesophageal reflux disease, unspecified whether esophagitis present She has been experiencing acid reflux. She takes Nexium 20 mg daily.  5. Elevated glucose Krista Harrison has history of an elevated glucose level.  6. Vitamin D deficiency Krista Harrison is currently taking no vitamin D supplement.  7. Depression screening Krista Harrison was screened for depression as part of her new patient workup today.  PHQ-9 is 9.  8. At risk for activity intolerance Krista Harrison is at risk for activity intolerance due to fatigue and shortness of breath.  Assessment/Plan:   1. Other fatigue Krista Harrison does feel that her weight is causing her energy to be lower than it should be. Fatigue may be related to obesity, depression or many other causes. Labs will be ordered, and in the meanwhile, Jermaine will focus on self care including  making healthy food choices, increasing physical activity and focusing on stress reduction.  Will check thyroid panel today.  - EKG 12-Lead - Comprehensive metabolic panel - Hemoglobin A1c - Insulin, random - T3 - T4, free - TSH - VITAMIN D 25 Hydroxy (Vit-D Deficiency, Fractures)  2. SOB (shortness of breath) on exertion Krista Harrison does feel that she gets out of  breath more easily that she used to when she exercises. Krista Harrison's shortness of breath appears to be obesity related and exercise induced. She has agreed to work on weight loss and gradually increase exercise to treat her exercise induced shortness of breath. Will continue to monitor closely.  - T3 - T4, free - TSH  3. Essential hypertension Krista Harrison is working on healthy weight loss and exercise to improve blood pressure control. We will watch for signs of hypotension as she continues her lifestyle modifications.  Continue medication.  Will check CMP today.  - Comprehensive metabolic panel  4. Gastroesophageal reflux disease, unspecified whether esophagitis present Intensive lifestyle modifications are the first line treatment for this issue. We discussed several lifestyle modifications today and she will continue to work on diet, exercise and weight loss efforts.  Continue Nexium.  Counseling . If a person has gastroesophageal reflux disease (GERD), food and stomach acid move back up into the esophagus and cause symptoms or problems such as damage to the esophagus. . Anti-reflux measures include: raising the head of the bed, avoiding tight clothing or belts, avoiding eating late at night, not lying down shortly after mealtime, and achieving weight loss. . Avoid ASA, NSAID's, caffeine, alcohol, and tobacco.  . OTC Pepcid and/or Tums are often very helpful for as needed use.  [name] However, for persisting chronic or daily symptoms, stronger medications like Omeprazole may be needed. . You may need to avoid foods and drinks such as: ? Coffee and tea (with or without caffeine). ? Drinks that contain alcohol. ? Energy drinks and sports drinks. ? Bubbly (carbonated) drinks or sodas. ? Chocolate and cocoa. ? Peppermint and mint flavorings. ? Garlic and onions. ? Horseradish. ? Spicy and acidic foods. These include peppers, chili powder, curry powder, vinegar, hot sauces, and BBQ sauce. ? Citrus fruit  juices and citrus fruits, such as oranges, lemons, and limes. ? Tomato-based foods. These include red sauce, chili, salsa, and pizza with red sauce. ? Fried and fatty foods. These include donuts, french fries, potato chips, and high-fat dressings. ? High-fat meats. These include hot dogs, rib eye steak, sausage, ham, and bacon.  5. Elevated glucose Will check insulin level and A1c today.  6. Vitamin D deficiency Low Vitamin D level contributes to fatigue and are associated with obesity, breast, and colon cancer.  Will check vitamin D level today, as per below.  - VITAMIN D 25 Hydroxy (Vit-D Deficiency, Fractures)  7. Depression screening Krista Harrison had a positive depression screening. Depression is commonly associated with obesity and often results in emotional eating behaviors. We will monitor this closely and work on CBT to help improve the non-hunger eating patterns. Referral to Psychology may be required if no improvement is seen as she continues in our clinic.  8. At risk for activity intolerance Krista Harrison was given approximately 15 minutes of exercise intolerance counseling today. She is 60 y.o. female and has risk factors exercise intolerance including obesity. We discussed intensive lifestyle modifications today with an emphasis on specific weight loss instructions and strategies. Krista Harrison will slowly increase activity as tolerated.  Repetitive spaced learning was employed today to  elicit superior memory formation and behavioral change.  9. Class 3 severe obesity with serious comorbidity and body mass index (BMI) of 45.0 to 49.9 in adult, unspecified obesity type [facility])  Kwanza is currently in the action stage of change and her goal is to continue with weight loss efforts. I recommend Shantey begin the structured treatment plan as follows:  She has agreed to the Category 3 Plan.  She will work on meal planning, intentional eating, and decreasing her intake of carbohydrates.  Labs from 05/31/2020,  including CMP, lipid panel, and CBC, were reviewed with the patient today.  Exercise goals: Walking (scheduled) and arm weights.   Behavioral modification strategies: increasing lean protein intake, decreasing simple carbohydrates, increasing vegetables, increasing water intake, decreasing eating out, no skipping meals, meal planning and cooking strategies, keeping healthy foods in the home and planning for success.  She was informed of the importance of frequent follow-up visits to maximize her success with intensive lifestyle modifications for her multiple health conditions. She was informed we would discuss her lab results at her next visit unless there is a critical issue that needs to be addressed sooner. Krista Harrison agreed to keep her next visit at the agreed upon time to discuss these results.  Objective:   Blood pressure 139/83, pulse 92, temperature 98.3 F (36.8 C), temperature source Oral, height 5\' 1"  (1.549 m), weight 262 lb (118.8 kg), SpO2 94 %. Body mass index is 49.5 kg/m.  EKG: Normal sinus rhythm, rate 92 bpm.  Indirect Calorimeter completed today shows a VO2 of 326 and a REE of 2269.  Her calculated basal metabolic rate is [id number] thus her basal metabolic rate is better than expected.  General: Cooperative, alert, well developed, in no acute distress. HEENT: Conjunctivae and lids unremarkable. Cardiovascular: Regular rhythm.  Lungs: Normal work of breathing. Neurologic: No focal deficits.   Lab Results  Component Value Date   CREATININE 0.79 10/25/2020   BUN 14 10/25/2020   NA 139 10/25/2020   K 4.4 10/25/2020   CL 99 10/25/2020   CO2 25 10/25/2020   Lab Results  Component Value Date   ALT 18 10/25/2020   AST 13 10/25/2020   ALKPHOS 98 10/25/2020   BILITOT 0.2 10/25/2020   Lab Results  Component Value Date   HGBA1C 6.4 (H) 10/25/2020   Lab Results  Component Value Date   INSULIN 19.2 10/25/2020   Lab Results  Component Value Date   TSH 2.970 10/25/2020    Lab Results  Component Value Date   CHOL 194 05/31/2020   HDL 58 05/31/2020   LDLCALC 120 (H) 05/31/2020   TRIG 89 05/31/2020   CHOLHDL 2.9 04/04/2018   Lab Results  Component Value Date   WBC 7.4 05/31/2020   HGB 14.5 05/31/2020   HCT 43.2 05/31/2020   MCV 92 05/31/2020   PLT 310 05/31/2020   Attestation Statements:   Reviewed by clinician on day of visit: allergies, medications, problem list, medical history, surgical history, family history, social history, and previous encounter notes.  I, Water quality scientist, CMA, am acting as Location manager for [employer], DO  I have reviewed the above documentation for accuracy and completeness, and I agree with the above. Jearld Lesch, DO

## 2020-10-28 ENCOUNTER — Encounter (INDEPENDENT_AMBULATORY_CARE_PROVIDER_SITE_OTHER): Payer: Self-pay | Admitting: Bariatrics

## 2020-11-08 ENCOUNTER — Ambulatory Visit (INDEPENDENT_AMBULATORY_CARE_PROVIDER_SITE_OTHER): Payer: BC Managed Care – PPO | Admitting: Bariatrics

## 2020-11-08 ENCOUNTER — Encounter (INDEPENDENT_AMBULATORY_CARE_PROVIDER_SITE_OTHER): Payer: Self-pay | Admitting: Bariatrics

## 2020-11-08 ENCOUNTER — Other Ambulatory Visit: Payer: Self-pay

## 2020-11-08 VITALS — BP 135/85 | HR 84 | Temp 98.3°F | Ht 61.0 in | Wt 256.0 lb

## 2020-11-08 DIAGNOSIS — Z9189 Other specified personal risk factors, not elsewhere classified: Secondary | ICD-10-CM | POA: Diagnosis not present

## 2020-11-08 DIAGNOSIS — R7303 Prediabetes: Secondary | ICD-10-CM | POA: Diagnosis not present

## 2020-11-08 DIAGNOSIS — Z6841 Body Mass Index (BMI) 40.0 and over, adult: Secondary | ICD-10-CM

## 2020-11-08 DIAGNOSIS — E559 Vitamin D deficiency, unspecified: Secondary | ICD-10-CM

## 2020-11-08 MED ORDER — VITAMIN D (ERGOCALCIFEROL) 1.25 MG (50000 UNIT) PO CAPS: 50000.0000 [IU] | ORAL_CAPSULE | ORAL | 0 refills | Status: DC

## 2020-11-09 ENCOUNTER — Encounter (INDEPENDENT_AMBULATORY_CARE_PROVIDER_SITE_OTHER): Payer: Self-pay | Admitting: Bariatrics

## 2020-11-09 NOTE — Progress Notes (Signed)
Chief Complaint:   OBESITY Krista Harrison is here to discuss her progress with her obesity treatment plan along with follow-up of her obesity related diagnoses. Krista Harrison is on the Category 3 Plan and states she is following her eating plan approximately 90% of the time. Krista Harrison states she is not exercising regularly at this time.  Today's visit was #: 2 Starting weight: 262 lbs Starting date: 10/25/2020 Today's weight: 256 lbs Today's date: 11/08/2020 Total lbs lost to date: 6 lbs Total lbs lost since last in-office visit: 6 lbs  Interim History: Krista Harrison has lost 6 pounds since her last visit.  She has struggled with her water.  Subjective:   1. Prediabetes Krista Harrison has a diagnosis of prediabetes based on her elevated HgA1c and was informed this puts her at greater risk of developing diabetes. She continues to work on diet and exercise to decrease her risk of diabetes. She denies nausea or hypoglycemia.  She endorses hunger.  A1c 6.4, insulin 18.4.  She is on no medications.  Prediabetes handout was provided.  She does not want to be on medication.   Lab Results  Component Value Date   HGBA1C 6.4 (H) 10/25/2020   Lab Results  Component Value Date   INSULIN 19.2 10/25/2020   2. Vitamin D deficiency Krista Harrison's Vitamin D level was 27.0 on 10/25/2020. She is currently taking no vitamin D supplement.   3. At risk of diabetes mellitus Krista Harrison is at higher than average risk for developing diabetes due to obesity and her prediabetes diagnosis.   Assessment/Plan:   1. Prediabetes Krista Harrison will continue to work on weight loss, exercise, and decreasing simple carbohydrates to help decrease the risk of diabetes.  She will always eat breakfast and will increase her intake of protein.  She was provided information on Wegovy/Ozempic.  2. Vitamin D deficiency Low Vitamin D level contributes to fatigue and are associated with obesity, breast, and colon cancer. She agrees to start to take prescription Vitamin D  @50 ,000 IU every week and will follow-up for routine testing of Vitamin D, at least 2-3 times per year to avoid over-replacement.  Prescription for vitamin D was sent to her pharmacy today.  3. At risk of diabetes mellitus Krista Harrison was given approximately 15 minutes of diabetes education and counseling today. We discussed intensive lifestyle modifications today with an emphasis on weight loss as well as increasing exercise and decreasing simple carbohydrates in her diet. We also reviewed medication options with an emphasis on risk versus benefit of those discussed.   Repetitive spaced learning was employed today to elicit superior memory formation and behavioral change.  4. Class 3 severe obesity with serious comorbidity and body mass index (BMI) of 45.0 to 49.9 in adult, unspecified obesity type Krista Harrison)  Krista Harrison is currently in the action stage of change. As such, her goal is to continue with weight loss efforts. She has agreed to the Category 3 Plan with lunch and dinner options.   She will work on meal planning and intentional eating.  Labs from 10/25/2020, including CMP, vitamin D, A1c, insulin level, and thyroid panel were reviewed with the patient today.    Exercise goals: For substantial health benefits, adults should do at least 150 minutes (2 hours and 30 minutes) a week of moderate-intensity, or 75 minutes (1 hour and 15 minutes) a week of vigorous-intensity aerobic physical activity, or an equivalent combination of moderate- and vigorous-intensity aerobic activity. Aerobic activity should be performed in episodes of at least 10 minutes,  and preferably, it should be spread throughout the week.  Behavioral modification strategies: increasing lean protein intake, decreasing simple carbohydrates, increasing vegetables, increasing water intake, decreasing eating out, no skipping meals, meal planning and cooking strategies, keeping healthy foods in the home, ways to avoid boredom eating, ways to avoid  night time snacking, emotional eating strategies and planning for success.  Krista Harrison has agreed to follow-up with our clinic in 2 weeks. She was informed of the importance of frequent follow-up visits to maximize her success with intensive lifestyle modifications for her multiple health conditions.   Objective:   Blood pressure 135/85, pulse 84, temperature 98.3 F (36.8 C), temperature source Oral, height 5\' 1"  (1.549 m), weight 256 lb (116.1 kg), SpO2 97 %. Body mass index is 48.37 kg/m.  General: Cooperative, alert, well developed, in no acute distress. HEENT: Conjunctivae and lids unremarkable. Cardiovascular: Regular rhythm.  Lungs: Normal work of breathing. Neurologic: No focal deficits.   Lab Results  Component Value Date   CREATININE 0.79 10/25/2020   BUN 14 10/25/2020   NA 139 10/25/2020   K 4.4 10/25/2020   CL 99 10/25/2020   CO2 25 10/25/2020   Lab Results  Component Value Date   ALT 18 10/25/2020   AST 13 10/25/2020   ALKPHOS 98 10/25/2020   BILITOT 0.2 10/25/2020   Lab Results  Component Value Date   HGBA1C 6.4 (H) 10/25/2020   Lab Results  Component Value Date   INSULIN 19.2 10/25/2020   Lab Results  Component Value Date   TSH 2.970 10/25/2020   Lab Results  Component Value Date   CHOL 194 05/31/2020   HDL 58 05/31/2020   LDLCALC 120 (H) 05/31/2020   TRIG 89 05/31/2020   CHOLHDL 2.9 04/04/2018   Lab Results  Component Value Date   WBC 7.4 05/31/2020   HGB 14.5 05/31/2020   HCT 43.2 05/31/2020   MCV 92 05/31/2020   PLT 310 05/31/2020   Attestation Statements:   Reviewed by clinician on day of visit: allergies, medications, problem list, medical history, surgical history, family history, social history, and previous encounter notes.  I, Water quality scientist, CMA, am acting as Location manager for [employer], DO  I have reviewed the above documentation for accuracy and completeness, and I agree with the above. Jearld Lesch, DO

## 2020-11-10 DIAGNOSIS — B029 Zoster without complications: Secondary | ICD-10-CM | POA: Diagnosis not present

## 2020-11-25 ENCOUNTER — Other Ambulatory Visit: Payer: Self-pay

## 2020-11-25 ENCOUNTER — Ambulatory Visit (INDEPENDENT_AMBULATORY_CARE_PROVIDER_SITE_OTHER): Payer: BC Managed Care – PPO | Admitting: Bariatrics

## 2020-11-25 ENCOUNTER — Encounter (INDEPENDENT_AMBULATORY_CARE_PROVIDER_SITE_OTHER): Payer: Self-pay | Admitting: Bariatrics

## 2020-11-25 VITALS — BP 117/72 | HR 89 | Temp 98.7°F | Ht 61.0 in | Wt 252.0 lb

## 2020-11-25 DIAGNOSIS — R7303 Prediabetes: Secondary | ICD-10-CM | POA: Diagnosis not present

## 2020-11-25 DIAGNOSIS — Z6841 Body Mass Index (BMI) 40.0 and over, adult: Secondary | ICD-10-CM | POA: Diagnosis not present

## 2020-11-25 DIAGNOSIS — I10 Essential (primary) hypertension: Secondary | ICD-10-CM

## 2020-11-29 NOTE — Progress Notes (Signed)
Chief Complaint:   OBESITY Krista Harrison is here to discuss her progress with her obesity treatment plan along with follow-up of her obesity related diagnoses. Krista Harrison is on the Category 3 Plan with lunch and dinner options and states she is following her eating plan approximately 97% of the time. Krista Harrison states she is doing 0 minutes 0 times per week.  Today's visit was #: 3 Starting weight: 262 lbs Starting date: 10/25/2020 Today's weight: 252 lbs Today's date: 11/25/2020 Total lbs lost to date: 10 Total lbs lost since last in-office visit: 4  Interim History: Krista Harrison is down 4 lbs and she is doing well overall.  Subjective:   1. Essential hypertension Krista Harrison is on Zestoretic and her blood pressure is controlled today.  2. Pre-diabetes Krista Harrison's last A1c was 6.4, and she is not on medications.  Assessment/Plan:   1. Essential hypertension Krista Harrison will continue her medications, and will continue working on healthy weight loss, and increasing exercise to improve blood pressure control. No added sodium. We will watch for signs of hypotension as she continues her lifestyle modifications.  2. Pre-diabetes Krista Harrison will continue to work on weight loss, exercise, increasing healthy protein and healthy fats, and decreasing simple carbohydrates to help decrease the risk of diabetes.   3. Class 3 severe obesity with serious comorbidity and body mass index (BMI) of 45.0 to 49.9 in adult, unspecified obesity type [facility]) Krista Harrison is currently in the action stage of change. As such, her goal is to continue with weight loss efforts. She has agreed to the Category 3 Plan.   We discussed mindful eating and intentional eating.   Exercise goals: She will start walking.  Behavioral modification strategies: increasing lean protein intake, decreasing simple carbohydrates, increasing vegetables, increasing water intake, decreasing eating out, no skipping meals, meal planning and cooking strategies, keeping healthy  foods in the home and planning for success.  Krista Harrison has agreed to follow-up with our clinic in 2 to 3 weeks. She was informed of the importance of frequent follow-up visits to maximize her success with intensive lifestyle modifications for her multiple health conditions.   Objective:   Blood pressure 117/72, pulse 89, temperature 98.7 F (37.1 C), height 5\' 1"  (1.549 m), weight 252 lb (114.3 kg), SpO2 96 %. Body mass index is 47.61 kg/m.  General: Cooperative, alert, well developed, in no acute distress. HEENT: Conjunctivae and lids unremarkable. Cardiovascular: Regular rhythm.  Lungs: Normal work of breathing. Neurologic: No focal deficits.   Lab Results  Component Value Date   CREATININE 0.79 10/25/2020   BUN 14 10/25/2020   NA 139 10/25/2020   K 4.4 10/25/2020   CL 99 10/25/2020   CO2 25 10/25/2020   Lab Results  Component Value Date   ALT 18 10/25/2020   AST 13 10/25/2020   ALKPHOS 98 10/25/2020   BILITOT 0.2 10/25/2020   Lab Results  Component Value Date   HGBA1C 6.4 (H) 10/25/2020   Lab Results  Component Value Date   INSULIN 19.2 10/25/2020   Lab Results  Component Value Date   TSH 2.970 10/25/2020   Lab Results  Component Value Date   CHOL 194 05/31/2020   HDL 58 05/31/2020   LDLCALC 120 (H) 05/31/2020   TRIG 89 05/31/2020   CHOLHDL 2.9 04/04/2018   Lab Results  Component Value Date   WBC 7.4 05/31/2020   HGB 14.5 05/31/2020   HCT 43.2 05/31/2020   MCV 92 05/31/2020   PLT 310 05/31/2020   No  results found for: IRON, TIBC, FERRITIN  Attestation Statements:   Reviewed by clinician on day of visit: allergies, medications, problem list, medical history, surgical history, family history, social history, and previous encounter notes.   Wilhemena Durie, am acting as Location manager for [employer], DO.  I have reviewed the above documentation for accuracy and completeness, and I agree with the above. Jearld Lesch, DO

## 2020-12-01 ENCOUNTER — Ambulatory Visit: Payer: BC Managed Care – PPO | Admitting: Family Medicine

## 2020-12-01 ENCOUNTER — Encounter (INDEPENDENT_AMBULATORY_CARE_PROVIDER_SITE_OTHER): Payer: Self-pay | Admitting: Bariatrics

## 2020-12-17 ENCOUNTER — Ambulatory Visit: Payer: BC Managed Care – PPO | Admitting: Family Medicine

## 2020-12-17 ENCOUNTER — Encounter: Payer: Self-pay | Admitting: Family Medicine

## 2020-12-17 ENCOUNTER — Other Ambulatory Visit: Payer: Self-pay

## 2020-12-17 VITALS — BP 142/92 | HR 96 | Ht 61.0 in | Wt 257.0 lb

## 2020-12-17 DIAGNOSIS — F419 Anxiety disorder, unspecified: Secondary | ICD-10-CM

## 2020-12-17 DIAGNOSIS — E7801 Familial hypercholesterolemia: Secondary | ICD-10-CM | POA: Diagnosis not present

## 2020-12-17 DIAGNOSIS — I10 Essential (primary) hypertension: Secondary | ICD-10-CM

## 2020-12-17 DIAGNOSIS — R7303 Prediabetes: Secondary | ICD-10-CM

## 2020-12-17 DIAGNOSIS — J301 Allergic rhinitis due to pollen: Secondary | ICD-10-CM

## 2020-12-17 MED ORDER — LISINOPRIL-HYDROCHLOROTHIAZIDE 10-12.5 MG PO TABS: 1.0000 | ORAL_TABLET | Freq: Every day | ORAL | 1 refills | Status: DC

## 2020-12-17 MED ORDER — ALPRAZOLAM 0.25 MG PO TABS: 0.2500 mg | ORAL_TABLET | Freq: Every day | ORAL | 5 refills | Status: DC | PRN

## 2020-12-17 NOTE — Patient Instructions (Signed)

## 2020-12-17 NOTE — Progress Notes (Signed)
Date:  12/17/2020   Name:  Krista Harrison   DOB:  1961/06/12   MRN:  [id number]   Chief Complaint: Hypertension and Anxiety  Hypertension This is a chronic problem. The current episode started more than 1 year ago. The problem is uncontrolled (no meds couple of days). Pertinent negatives include no blurred vision, chest pain, headaches, malaise/fatigue, neck pain, orthopnea, palpitations, peripheral edema, PND, shortness of breath or sweats. There are no associated agents to hypertension. Risk factors for coronary artery disease include dyslipidemia and obesity. Past treatments include diuretics and ACE inhibitors. The current treatment provides moderate improvement. There are no compliance problems.  There is no history of angina, kidney disease, CAD/MI, CVA, heart failure, left ventricular hypertrophy, PVD or retinopathy. There is no history of chronic renal disease, a hypertension causing med or renovascular disease.  Anxiety Presents for follow-up visit. Symptoms include excessive worry. Patient reports no chest pain, dizziness, impotence, irritability, muscle tension, nausea, nervous/anxious behavior, palpitations or shortness of breath. Symptoms occur occasionally.    Diabetes She presents for her follow-up diabetic visit. She has type 2 diabetes mellitus. There are no hypoglycemic associated symptoms. Pertinent negatives for hypoglycemia include no dizziness, headaches, nervousness/anxiousness or sweats. Pertinent negatives for diabetes include no blurred vision, no chest pain, no fatigue, no foot paresthesias, no foot ulcerations, no polydipsia, no polyphagia, no polyuria, no visual change, no weakness and no weight loss. There are no hypoglycemic complications. Symptoms are stable. There are no diabetic complications. Pertinent negatives for diabetic complications include no autonomic neuropathy, CVA, heart disease, impotence, nephropathy, peripheral neuropathy, PVD or retinopathy. Risk  factors for coronary artery disease include hypertension and dyslipidemia. Current diabetic treatment includes diet. She is following a generally healthy diet. She participates in exercise daily. An ACE inhibitor/angiotensin II receptor blocker is being taken.  Hyperlipidemia This is a chronic problem. The current episode started more than 1 year ago. The problem is controlled. Recent lipid tests were reviewed and are variable. Exacerbating diseases include diabetes and obesity. She has no history of chronic renal disease, hypothyroidism, liver disease or nephrotic syndrome. Pertinent negatives include no chest pain, focal sensory loss, focal weakness, leg pain, myalgias or shortness of breath. Current antihyperlipidemic treatment includes diet change.    Lab Results  Component Value Date   CREATININE 0.79 10/25/2020   BUN 14 10/25/2020   NA 139 10/25/2020   K 4.4 10/25/2020   CL 99 10/25/2020   CO2 25 10/25/2020   Lab Results  Component Value Date   CHOL 194 05/31/2020   HDL 58 05/31/2020   LDLCALC 120 (H) 05/31/2020   TRIG 89 05/31/2020   CHOLHDL 2.9 04/04/2018   Lab Results  Component Value Date   TSH 2.970 10/25/2020   Lab Results  Component Value Date   HGBA1C 6.4 (H) 10/25/2020   Lab Results  Component Value Date   WBC 7.4 05/31/2020   HGB 14.5 05/31/2020   HCT 43.2 05/31/2020   MCV 92 05/31/2020   PLT 310 05/31/2020   Lab Results  Component Value Date   ALT 18 10/25/2020   AST 13 10/25/2020   ALKPHOS 98 10/25/2020   BILITOT 0.2 10/25/2020     Review of Systems  Constitutional: Negative.  Negative for chills, fatigue, fever, irritability, malaise/fatigue, unexpected weight change and weight loss.  HENT: Negative for congestion, ear discharge, ear pain, rhinorrhea, sinus pressure, sneezing and sore throat.   Eyes: Negative for blurred vision, double vision, photophobia, pain, discharge, redness and  itching.  Respiratory: Negative for cough, shortness of breath,  wheezing and stridor.   Cardiovascular: Negative for chest pain, palpitations, orthopnea and PND.  Gastrointestinal: Negative for abdominal pain, blood in stool, constipation, diarrhea, nausea and vomiting.  Endocrine: Negative for cold intolerance, heat intolerance, polydipsia, polyphagia and polyuria.  Genitourinary: Negative for dysuria, flank pain, frequency, hematuria, impotence, menstrual problem, pelvic pain, urgency, vaginal bleeding and vaginal discharge.  Musculoskeletal: Negative for arthralgias, back pain, myalgias and neck pain.  Skin: Negative for rash.  Allergic/Immunologic: Negative for environmental allergies and food allergies.  Neurological: Negative for dizziness, focal weakness, weakness, light-headedness, numbness and headaches.  Hematological: Negative for adenopathy. Does not bruise/bleed easily.  Psychiatric/Behavioral: Negative for dysphoric mood. The patient is not nervous/anxious.     Patient Active Problem List   Diagnosis Date Noted  . Morbid obesity ([place]) 10/28/2020  . Prediabetes 10/26/2020  . Vitamin D deficiency 10/26/2020  . Pure hypercholesterolemia 04/04/2018  . Rash, skin 04/04/2018  . BRCA gene mutation negative 03/21/2017  . Essential hypertension 09/25/2016  . Gastroesophageal reflux disease 09/25/2016  . Acute anxiety 09/25/2016    No Known Allergies  Past Surgical History:  Procedure Laterality Date  . ABDOMINAL HYSTERECTOMY  2009  . ROTATOR CUFF REPAIR Bilateral 2005, 2006    Social History   Tobacco Use  . Smoking status: Never Smoker  . Smokeless tobacco: Never Used  Vaping Use  . Vaping Use: Never used  Substance Use Topics  . Alcohol use: Yes    Alcohol/week: 0.0 standard drinks    Comment: socially  . Drug use: No     Medication list has been reviewed and updated.  Current Meds  Medication Sig  . ALPRAZolam (XANAX) 0.25 MG tablet Take 1 tablet (0.25 mg total) by mouth daily as needed.  [name]  lisinopril-hydrochlorothiazide (ZESTORETIC) 10-12.5 MG tablet Take 1 tablet by mouth daily.  . meloxicam (MOBIC) 15 MG tablet TAKE 1 TABLET BY MOUTH EVERY DAY  . [DISCONTINUED] Vitamin D, Ergocalciferol, (DRISDOL) 1.25 MG (50000 UNIT) CAPS capsule Take 1 capsule (50,000 Units total) by mouth every 7 (seven) days.    PHQ 2/9 Scores 12/17/2020 10/25/2020 05/31/2020 09/09/2019  PHQ - 2 Score 0 3 0 0  PHQ- 9 Score 0 9 0 1    GAD 7 : Generalized Anxiety Score 12/17/2020 05/31/2020 09/09/2019  Nervous, Anxious, on Edge 2 1 0  Control/stop worrying 1 1 0  Worry too much - different things 0 1 0  Trouble relaxing 0 0 0  Restless 0 0 0  Easily annoyed or irritable 0 0 0  Afraid - awful might happen 0 0 1  Total GAD 7 Score '3 3 1  ' Anxiety Difficulty Not difficult at all Not difficult at all -    BP Readings from Last 3 Encounters:  12/17/20 (!) 142/92  11/25/20 117/72  11/08/20 135/85    Physical Exam Vitals and nursing note reviewed.  Constitutional:      Appearance: She is well-developed and well-nourished.  HENT:     Head: Normocephalic.     Right Ear: Tympanic membrane and external ear normal.     Left Ear: Tympanic membrane and external ear normal.     Nose: Nose normal.     Mouth/Throat:     Mouth: Oropharynx is clear and moist.  Eyes:     General: Lids are everted, no foreign bodies appreciated. No scleral icterus.       Left eye: No foreign body or hordeolum.  Extraocular Movements: EOM normal.     Conjunctiva/sclera: Conjunctivae normal.     Right eye: Right conjunctiva is not injected.     Left eye: Left conjunctiva is not injected.     Pupils: Pupils are equal, round, and reactive to light.  Neck:     Thyroid: No thyromegaly.     Vascular: No JVD.     Trachea: No tracheal deviation.  Cardiovascular:     Rate and Rhythm: Normal rate and regular rhythm.     Pulses: Normal pulses and intact distal pulses.     Heart sounds: Normal heart sounds, S1 normal and S2 normal.  No murmur heard.  No systolic murmur is present.  No diastolic murmur is present. No friction rub. No gallop. No S3 or S4 sounds.   Pulmonary:     Effort: Pulmonary effort is normal. No respiratory distress.     Breath sounds: Normal breath sounds. No wheezing or rales.  Abdominal:     General: Bowel sounds are normal.     Palpations: Abdomen is soft. There is no hepatosplenomegaly or mass.     Tenderness: There is no abdominal tenderness. There is no guarding or rebound.  Musculoskeletal:        General: No tenderness or edema. Normal range of motion.     Cervical back: Normal range of motion and neck supple.     Right lower leg: No edema.     Left lower leg: No edema.  Lymphadenopathy:     Cervical: No cervical adenopathy.  Skin:    General: Skin is warm.     Findings: No rash.  Neurological:     Mental Status: She is alert and oriented to person, place, and time.     Cranial Nerves: No cranial nerve deficit.     Deep Tendon Reflexes: Strength normal. Reflexes normal.  Psychiatric:        Mood and Affect: Mood and affect normal. Mood is not anxious or depressed.     Wt Readings from Last 3 Encounters:  12/17/20 257 lb (116.6 kg)  11/25/20 252 lb (114.3 kg)  11/08/20 256 lb (116.1 kg)    BP (!) 142/92   Pulse 96   Ht '5\' 1"'  (1.549 m)   Wt 257 lb (116.6 kg)   BMI 48.56 kg/m    Assessment and Plan: 1. Essential hypertension Chronic.  Uncontrolled.  Because patient did not take medication today.  Stable.  Will resume lisinopril hydrochlorothiazide 10-12.5 mg once a day.  Will recheck blood pressure in 4 months. - lisinopril-hydrochlorothiazide (ZESTORETIC) 10-12.5 MG tablet; Take 1 tablet by mouth daily.  Dispense: 90 tablet; Refill: 1  2. Acute anxiety Chronic.  Episodic.  Patient does well with the acquisition and the ability to take alprazolam 0.25 once a day as needed. - ALPRAZolam (XANAX) 0.25 MG tablet; Take 1 tablet (0.25 mg total) by mouth daily as needed.   Dispense: 30 tablet; Refill: 5  3. Prediabetes New onset.  It was noted that A1c was 6.  4 at weight loss clinic.  Dietary approach would like to be done at this time although we did discuss the possibility of Ozempic to be taken on a weekly basis and the weight loss and the glycemic control that with coexist.  She is to discuss this with her weight loss clinic and we will see patient in 4 months for recheck.  4. Familial hypercholesterolemia Chronic.  Controlled.  Stable.  LDL is in the 120s fasting and patient  was given low-cholesterol guidelines at this time.  We will recheck in 4 months and fasting.  5. Seasonal allergic rhinitis due to pollen Patient has some seasonal rhinitis and has been suggested that she acquire fluticasone.

## 2020-12-21 ENCOUNTER — Ambulatory Visit (INDEPENDENT_AMBULATORY_CARE_PROVIDER_SITE_OTHER): Payer: BC Managed Care – PPO | Admitting: Bariatrics

## 2020-12-21 ENCOUNTER — Other Ambulatory Visit: Payer: Self-pay

## 2020-12-21 ENCOUNTER — Encounter (INDEPENDENT_AMBULATORY_CARE_PROVIDER_SITE_OTHER): Payer: Self-pay | Admitting: Bariatrics

## 2020-12-21 VITALS — BP 123/85 | HR 71 | Temp 98.0°F | Ht 61.0 in | Wt 255.0 lb

## 2020-12-21 DIAGNOSIS — R7303 Prediabetes: Secondary | ICD-10-CM

## 2020-12-21 DIAGNOSIS — Z9189 Other specified personal risk factors, not elsewhere classified: Secondary | ICD-10-CM | POA: Diagnosis not present

## 2020-12-21 DIAGNOSIS — E662 Morbid (severe) obesity with alveolar hypoventilation: Secondary | ICD-10-CM | POA: Diagnosis not present

## 2020-12-21 DIAGNOSIS — R632 Polyphagia: Secondary | ICD-10-CM | POA: Diagnosis not present

## 2020-12-21 DIAGNOSIS — I10 Essential (primary) hypertension: Secondary | ICD-10-CM

## 2020-12-21 DIAGNOSIS — Z6841 Body Mass Index (BMI) 40.0 and over, adult: Secondary | ICD-10-CM

## 2020-12-21 MED ORDER — OZEMPIC (0.25 OR 0.5 MG/DOSE) 2 MG/1.5ML ~~LOC~~ SOPN: 0.2500 mg | PEN_INJECTOR | SUBCUTANEOUS | 0 refills | Status: DC

## 2020-12-22 NOTE — Progress Notes (Signed)
Chief Complaint:   OBESITY Krista Harrison is here to discuss her progress with her obesity treatment plan along with follow-up of her obesity related diagnoses. Krista Harrison is on the Category 3 Plan and states she is following her eating plan approximately 0% of the time. Krista Harrison states she is doing 0 minutes 0 times per week.  Today's visit was #: 4 Starting weight: 262 lbs Starting date: 10/25/2020 Today's weight: 255 lbs Today's date: 12/21/2020 Total lbs lost to date: 7 lbs Total lbs lost since last in-office visit: 0  Interim History: Krista Harrison is up 3 lbs after being out of town. She ate more unhealthy snacks.  Subjective:   1. Pre-diabetes Krista Harrison reports polyphagia.  Lab Results  Component Value Date   HGBA1C 6.4 (H) 10/25/2020   Lab Results  Component Value Date   INSULIN 19.2 10/25/2020    2. Essential hypertension Krista Harrison's BP is controlled.   BP Readings from Last 3 Encounters:  12/21/20 123/85  12/17/20 (!) 142/92  11/25/20 117/72    3. Polyphagia Krista Harrison reports increase in stress. She is pre-diabetic.  4. At risk for activity intolerance Krista Harrison is at risk for exercise intolerance due to obesity.  Assessment/Plan:   1. Pre-diabetes Krista Harrison will continue to work on weight loss, exercise, and decreasing simple carbohydrates to help decrease the risk of diabetes. Begin Ozempic, as per below.  - Semaglutide,0.25 or 0.5MG /DOS, (OZEMPIC, 0.25 OR 0.5 MG/DOSE,) 2 MG/1.5ML SOPN; Inject 0.25 mg into the skin once a week.  Dispense: 1.5 mL; Refill: 0  2. Essential hypertension Krista Harrison is working on healthy weight loss and exercise to improve blood pressure control. We will watch for signs of hypotension as she continues her lifestyle modifications. Continue current treatment plan.  3. Polyphagia Intensive lifestyle modifications are the first line treatment for this issue. We discussed several lifestyle modifications today and she will continue to work on diet, exercise and weight loss  efforts. Orders and follow up as documented in patient record. Begin Ozempic.  Counseling . Polyphagia is excessive hunger. . Causes can include: low blood sugars, hypERthyroidism, PMS, lack of sleep, stress, insulin resistance, diabetes, certain medications, and diets that are deficient in protein and fiber.   4. At risk for activity intolerance Krista Harrison was given approximately 15 minutes of exercise intolerance counseling today. She is 60 y.o. female and has risk factors exercise intolerance including obesity. We discussed intensive lifestyle modifications today with an emphasis on specific weight loss instructions and strategies. Krista Harrison will slowly increase activity as tolerated.  Repetitive spaced learning was employed today to elicit superior memory formation and behavioral change.  5. Class 3 obesity with alveolar hypoventilation, serious comorbidity, and body mass index (BMI) of 45.0 to 49.9 in adult Krista Harrison) Krista Harrison is currently in the action stage of change. As such, her goal is to continue with weight loss efforts. She has agreed to the Category 3 Plan.   Exercise goals: Walking  Behavioral modification strategies: increasing lean protein intake, decreasing simple carbohydrates, increasing vegetables, increasing water intake, decreasing eating out, no skipping meals, meal planning and cooking strategies and keeping healthy foods in the home.  Krista Harrison has agreed to follow-up with our clinic in 2 weeks. She was informed of the importance of frequent follow-up visits to maximize her success with intensive lifestyle modifications for her multiple health conditions.   Objective:   Blood pressure 123/85, pulse 71, temperature 98 F (36.7 C), height 5\' 1"  (1.549 m), weight 255 lb (115.7 kg), SpO2 93 %.  Body mass index is 48.18 kg/m.  General: Cooperative, alert, well developed, in no acute distress. HEENT: Conjunctivae and lids unremarkable. Cardiovascular: Regular rhythm.  Lungs: Normal work  of breathing. Neurologic: No focal deficits.   Lab Results  Component Value Date   CREATININE 0.79 10/25/2020   BUN 14 10/25/2020   NA 139 10/25/2020   K 4.4 10/25/2020   CL 99 10/25/2020   CO2 25 10/25/2020   Lab Results  Component Value Date   ALT 18 10/25/2020   AST 13 10/25/2020   ALKPHOS 98 10/25/2020   BILITOT 0.2 10/25/2020   Lab Results  Component Value Date   HGBA1C 6.4 (H) 10/25/2020   Lab Results  Component Value Date   INSULIN 19.2 10/25/2020   Lab Results  Component Value Date   TSH 2.970 10/25/2020   Lab Results  Component Value Date   CHOL 194 05/31/2020   HDL 58 05/31/2020   LDLCALC 120 (H) 05/31/2020   TRIG 89 05/31/2020   CHOLHDL 2.9 04/04/2018   Lab Results  Component Value Date   WBC 7.4 05/31/2020   HGB 14.5 05/31/2020   HCT 43.2 05/31/2020   MCV 92 05/31/2020   PLT 310 05/31/2020    Attestation Statements:   Reviewed by clinician on day of visit: allergies, medications, problem list, medical history, surgical history, family history, social history, and previous encounter notes.  Coral Ceo, am acting as Location manager for [employer], DO.  I have reviewed the above documentation for accuracy and completeness, and I agree with the above. Jearld Lesch, DO

## 2020-12-23 ENCOUNTER — Encounter (INDEPENDENT_AMBULATORY_CARE_PROVIDER_SITE_OTHER): Payer: Self-pay | Admitting: Bariatrics

## 2020-12-27 ENCOUNTER — Encounter (INDEPENDENT_AMBULATORY_CARE_PROVIDER_SITE_OTHER): Payer: Self-pay | Admitting: Bariatrics

## 2020-12-27 ENCOUNTER — Telehealth (INDEPENDENT_AMBULATORY_CARE_PROVIDER_SITE_OTHER): Payer: Self-pay | Admitting: Bariatrics

## 2020-12-27 NOTE — Telephone Encounter (Signed)
Spoke with patient.

## 2020-12-27 NOTE — Telephone Encounter (Signed)
Patient called to check status of prescription for ozempic.

## 2021-01-04 ENCOUNTER — Ambulatory Visit (INDEPENDENT_AMBULATORY_CARE_PROVIDER_SITE_OTHER): Payer: BC Managed Care – PPO | Admitting: Bariatrics

## 2021-01-04 ENCOUNTER — Encounter (INDEPENDENT_AMBULATORY_CARE_PROVIDER_SITE_OTHER): Payer: Self-pay

## 2021-01-05 ENCOUNTER — Encounter (INDEPENDENT_AMBULATORY_CARE_PROVIDER_SITE_OTHER): Payer: Self-pay

## 2021-04-04 ENCOUNTER — Ambulatory Visit: Payer: BC Managed Care – PPO | Admitting: Family Medicine

## 2021-05-03 ENCOUNTER — Ambulatory Visit: Payer: BC Managed Care – PPO | Admitting: Podiatry

## 2021-05-25 ENCOUNTER — Ambulatory Visit
Admission: EM | Admit: 2021-05-25 | Discharge: 2021-05-25 | Disposition: A | Discharge status: Home / Self Care | Payer: BC Managed Care – PPO

## 2021-05-25 ENCOUNTER — Other Ambulatory Visit: Payer: Self-pay

## 2021-05-25 ENCOUNTER — Emergency Department: Payer: BC Managed Care – PPO

## 2021-05-25 ENCOUNTER — Encounter: Payer: Self-pay | Admitting: Emergency Medicine

## 2021-05-25 ENCOUNTER — Emergency Department
Admission: EM | Admit: 2021-05-25 | Discharge: 2021-05-25 | Disposition: A | Discharge status: Home / Self Care | Payer: BC Managed Care – PPO | Attending: Emergency Medicine | Admitting: Emergency Medicine

## 2021-05-25 DIAGNOSIS — R0602 Shortness of breath: Secondary | ICD-10-CM | POA: Diagnosis not present

## 2021-05-25 DIAGNOSIS — Z79899 Other long term (current) drug therapy: Secondary | ICD-10-CM | POA: Insufficient documentation

## 2021-05-25 DIAGNOSIS — I10 Essential (primary) hypertension: Secondary | ICD-10-CM | POA: Insufficient documentation

## 2021-05-25 DIAGNOSIS — R197 Diarrhea, unspecified: Secondary | ICD-10-CM | POA: Diagnosis not present

## 2021-05-25 DIAGNOSIS — Z794 Long term (current) use of insulin: Secondary | ICD-10-CM | POA: Insufficient documentation

## 2021-05-25 DIAGNOSIS — R079 Chest pain, unspecified: Secondary | ICD-10-CM

## 2021-05-25 DIAGNOSIS — R0789 Other chest pain: Secondary | ICD-10-CM | POA: Diagnosis not present

## 2021-05-25 LAB — CBC
HCT: 43.6 % (ref 36.0–46.0)
Hemoglobin: 14.6 g/dL (ref 12.0–15.0)
MCH: 31.7 pg (ref 26.0–34.0)
MCHC: 33.5 g/dL (ref 30.0–36.0)
MCV: 94.8 fL (ref 80.0–100.0)
Platelets: 291 10*3/uL (ref 150–400)
RBC: 4.6 MIL/uL (ref 3.87–5.11)
RDW: 13.2 % (ref 11.5–15.5)
WBC: 10.1 10*3/uL (ref 4.0–10.5)
nRBC: 0 % (ref 0.0–0.2)

## 2021-05-25 LAB — TROPONIN I (HIGH SENSITIVITY)
Troponin I (High Sensitivity): 6 ng/L (ref <18)
Troponin I (High Sensitivity): 5 ng/L (ref <18)

## 2021-05-25 LAB — BASIC METABOLIC PANEL
Anion gap: 9 (ref 5–15)
BUN: 18 mg/dL (ref 6–20)
CO2: 25 mmol/L (ref 22–32)
Calcium: 9.4 mg/dL (ref 8.9–10.3)
Chloride: 102 mmol/L (ref 98–111)
Creatinine, Ser: 0.86 mg/dL (ref 0.44–1.00)
GFR, Estimated: >60 mL/min (ref >60)
Glucose, Bld: 122 mg/dL — ABNORMAL HIGH (ref 70–99)
Potassium: 4.1 mmol/L (ref 3.5–5.1)
Sodium: 136 mmol/L (ref 135–145)

## 2021-05-25 NOTE — ED Triage Notes (Signed)
Pt comes into the ED via POV c/o left chest pain with no radiation.  Pt states that the pain started this morning.  Pt does admit to dizziness and SHOB.  Pt denies any cardiac history that she is aware of.  Pt in NAD.

## 2021-05-25 NOTE — Discharge Instructions (Addendum)
Your exam, EKG, labs, and x-ray are all normal and reassuring as had vital signs of any acute heart attack, or underlying cause for your chest pain.  You should follow-up with your primary provider for ongoing symptoms.  Return to the ED if needed.

## 2021-05-25 NOTE — ED Notes (Addendum)
See triage note  Presents with some chest pain which started this am   Denies any cough or fever  Describe discomfort as squeezing pressure

## 2021-05-25 NOTE — ED Provider Notes (Signed)
[facility] Emergency Department Provider Note  ____________________________________________   Event Date/Time   First MD Initiated Contact with Patient 05/25/21 1343     (approximate)  I have reviewed the triage vital signs and the nursing notes.   HISTORY  Chief Complaint Chest Pain  HPI Krista Harrison is a 60 y.o. female with below medical history including factor V Leiden, GERD, hypertension, and anxiety presents to the ED for evaluation of central chest pain. She describes episodes of "chest squeeze" this morning. She denies nausea, diaphoresis, shortness of breath, or syncope.  Symptoms reportedly are fleeting, and did not radiate to the neck, arm, or back.  Patient denied any associated abdominal pain or vomiting, but did have an episode of diarrhea.  She wonders if the diarrhea was because she had work as above into minor panic attack with the onset of her chest pain.  She presents now reporting that her symptoms have resolved at the time of this evaluation.    Past Medical History:  Diagnosis Date   Anxiety    Edema of both lower extremities    Factor V Leiden ([place])    heterozygous   GERD (gastroesophageal reflux disease)    Hypertension     Patient Active Problem List   Diagnosis Date Noted   Morbid obesity ([place]) 10/28/2020   Prediabetes 10/26/2020   Vitamin D deficiency 10/26/2020   Pure hypercholesterolemia 04/04/2018   Rash, skin 04/04/2018   BRCA gene mutation negative 03/21/2017   Essential hypertension 09/25/2016   Gastroesophageal reflux disease 09/25/2016   Acute anxiety 09/25/2016    Past Surgical History:  Procedure Laterality Date   ABDOMINAL HYSTERECTOMY  2009   ROTATOR CUFF REPAIR Bilateral 2005, 2006    Prior to Admission medications   Medication Sig Start Date End Date Taking? Authorizing Provider  ALPRAZolam (XANAX) 0.25 MG tablet Take 1 tablet (0.25 mg total) by mouth daily as needed. 12/17/20   Juline Patch, MD   lisinopril-hydrochlorothiazide (ZESTORETIC) 10-12.5 MG tablet Take 1 tablet by mouth daily. 12/17/20   Juline Patch, MD  meloxicam (MOBIC) 15 MG tablet TAKE 1 TABLET BY MOUTH EVERY DAY 06/23/20   Juline Patch, MD  Semaglutide,0.25 or 0.5MG/DOS, (OZEMPIC, 0.25 OR 0.5 MG/DOSE,) 2 MG/1.5ML SOPN Inject 0.25 mg into the skin once a week. 12/21/20   Jearld Lesch A, DO  sertraline (ZOLOFT) 50 MG tablet Take 1 tablet (50 mg total) by mouth daily. 04/24/19 05/01/19  Juline Patch, MD    Allergies Patient has no known allergies.  Family History  Problem Relation Age of Onset   Cancer Mother        ovarian   Breast cancer Mother 81   COPD Father     Social History Social History   Tobacco Use   Smoking status: Never   Smokeless tobacco: Never  Vaping Use   Vaping Use: Never used  Substance Use Topics   Alcohol use: Yes    Alcohol/week: 0.0 standard drinks    Comment: socially   Drug use: No    Review of Systems  Constitutional: No fever/chills Eyes: No visual changes. ENT: No sore throat. Cardiovascular: Reports chest pain. Respiratory: Denies shortness of breath. Gastrointestinal: No abdominal pain.  No nausea, no vomiting.  No diarrhea.  No constipation. Genitourinary: Negative for dysuria. Musculoskeletal: Negative for back pain. Skin: Negative for rash. Neurological: Negative for headaches, focal weakness or numbness. ____________________________________________   PHYSICAL EXAM:  VITAL SIGNS: ED Triage Vitals  Enc Vitals Group     BP 05/25/21 1107 (!) 159/86     Pulse Rate 05/25/21 1107 84     Resp 05/25/21 1107 16     Temp 05/25/21 1107 98.4 F (36.9 C)     Temp Source 05/25/21 1107 Oral     SpO2 05/25/21 1107 94 %     Weight 05/25/21 1105 255 lb 1.2 oz (115.7 kg)     Height 05/25/21 1105 _0  (1.549 m)     Head Circumference --      Peak Flow --      Pain Score 05/25/21 1105 0     Pain Loc --      Pain Edu? --      Excl. in [place]? --     Constitutional:  Alert and oriented. Well appearing and in no acute distress. Eyes: Conjunctivae are normal. PERRL. EOMI. Head: Atraumatic. Nose: No congestion/rhinnorhea. Mouth/Throat: Mucous membranes are moist.  Oropharynx non-erythematous. Neck: No stridor.   Cardiovascular: Normal rate, regular rhythm. Grossly normal heart sounds.  Good peripheral circulation. Respiratory: Normal respiratory effort.  No retractions. Lungs CTAB. Gastrointestinal: Soft and nontender. No distention. No abdominal bruits. No CVA tenderness. Musculoskeletal: No lower extremity tenderness nor edema.  No joint effusions. Neurologic:  Normal speech and language. No gross focal neurologic deficits are appreciated. No gait instability. Skin:  Skin is warm, dry and intact. No rash noted. Psychiatric: Mood and affect are normal. Speech and behavior are normal.  ____________________________________________   LABS (all labs ordered are listed, but only abnormal results are displayed)  Labs Reviewed  BASIC METABOLIC PANEL - Abnormal; Notable for the following components:      Result Value   Glucose, Bld 122 (*)    All other components within normal limits  CBC  TROPONIN I (HIGH SENSITIVITY)  TROPONIN I (HIGH SENSITIVITY)   ____________________________________________  EKG  Normal sinus rhythm with sinus arrhythmia RBBB Ventricular rate 96 bpm PR interval 142 ms QRS duration 120 ms Normal axis No STEMI ____________________________________________  RADIOLOGY I, Melvenia Needles, personally viewed and evaluated these images (plain radiographs) as part of my medical decision making, as well as reviewing the written report by the radiologist.  ED MD interpretation:  agree with report  Official radiology report(s): DG Chest 2 View  Result Date: 05/25/2021 CLINICAL DATA:  Chest pressure and shortness of breath. EXAM: CHEST - 2 VIEW COMPARISON:  Chest x-ray dated December 02, 2018. FINDINGS: The heart size and  mediastinal contours are within normal limits. Both lungs are clear. The visualized skeletal structures are unremarkable. IMPRESSION: No active cardiopulmonary disease. Electronically Signed   By: Titus Dubin M.D.   On: 05/25/2021 12:26    ____________________________________________   PROCEDURES  Procedure(s) performed (including Critical Care):  Procedures   ____________________________________________   INITIAL IMPRESSION / ASSESSMENT AND PLAN / ED COURSE  As part of my medical decision making, I reviewed the following data within the [place] reviewed WNL, EKG interpreted RBBB and no ST segment changes, Radiograph reviewed NAD, and Notes from prior ED visits     Differential diagnosis includes, but is not limited to, ACS, aortic dissection, pulmonary embolism, cardiac tamponade, pneumothorax, pneumonia, pericarditis, myocarditis, GI-related causes including esophagitis/gastritis, and musculoskeletal chest wall pain.    Patient ED evaluation of episodic central chest tightness this morning with resolution of her symptoms.  Patient was evaluated in the ED for complaints, and found to have reassuring work-up at this time.  No  indication of ACS, PE, or acute MI.  Labs are without any acute abnormalities and troponin is negative x2.  EKG is reassuring as it showed no malignant arrhythmia.  No radiologic evidence of any intrathoracic process.  Patient stable at this time for discharge, will be referred to PCP for ongoing symptoms.  Return precautions have been reviewed. ____________________________________________   FINAL CLINICAL IMPRESSION(S) / ED DIAGNOSES  Final diagnoses:  Nonspecific chest pain     ED Discharge Orders     None        Note:  This document was prepared using Dragon voice recognition software and may include unintentional dictation errors.    Melvenia Needles, PA-C 05/25/21 1708    Carrie Mew, MD 05/25/21  Kathyrn Drown

## 2021-06-01 ENCOUNTER — Encounter: Payer: BC Managed Care – PPO | Admitting: Family Medicine

## 2021-06-17 ENCOUNTER — Encounter: Payer: BC Managed Care – PPO | Admitting: Family Medicine

## 2021-06-29 DIAGNOSIS — Z13220 Encounter for screening for lipoid disorders: Secondary | ICD-10-CM | POA: Diagnosis not present

## 2021-06-29 DIAGNOSIS — E559 Vitamin D deficiency, unspecified: Secondary | ICD-10-CM | POA: Diagnosis not present

## 2021-06-29 DIAGNOSIS — Z6841 Body Mass Index (BMI) 40.0 and over, adult: Secondary | ICD-10-CM | POA: Diagnosis not present

## 2021-06-29 DIAGNOSIS — I10 Essential (primary) hypertension: Secondary | ICD-10-CM | POA: Diagnosis not present

## 2021-06-29 DIAGNOSIS — R7303 Prediabetes: Secondary | ICD-10-CM | POA: Diagnosis not present

## 2021-06-29 DIAGNOSIS — Z79899 Other long term (current) drug therapy: Secondary | ICD-10-CM | POA: Diagnosis not present

## 2021-08-11 ENCOUNTER — Other Ambulatory Visit: Payer: Self-pay

## 2021-08-11 ENCOUNTER — Ambulatory Visit
Admission: RE | Admit: 2021-08-11 | Discharge: 2021-08-11 | Disposition: A | Discharge status: Home / Self Care | Payer: BC Managed Care – PPO | Source: Ambulatory Visit | Attending: Emergency Medicine | Admitting: Emergency Medicine

## 2021-08-11 VITALS — BP 143/69 | HR 92 | Temp 98.1°F | Resp 18

## 2021-08-11 DIAGNOSIS — N390 Urinary tract infection, site not specified: Secondary | ICD-10-CM | POA: Diagnosis not present

## 2021-08-11 DIAGNOSIS — R319 Hematuria, unspecified: Secondary | ICD-10-CM | POA: Diagnosis not present

## 2021-08-11 LAB — POCT URINALYSIS DIP (MANUAL ENTRY)
Bilirubin, UA: NEGATIVE
Glucose, UA: NEGATIVE mg/dL
Nitrite, UA: POSITIVE — AB
Spec Grav, UA: 1.025 (ref 1.010–1.025)
Urobilinogen, UA: 2 E.U./dL — AB
pH, UA: 6.5 (ref 5.0–8.0)

## 2021-08-11 MED ORDER — CEPHALEXIN 500 MG PO CAPS: 500.0000 mg | ORAL_CAPSULE | Freq: Two times a day (BID) | ORAL | 0 refills | Status: AC

## 2021-08-11 NOTE — Discharge Instructions (Addendum)
Take the antibiotic as directed.  The urine culture is pending.  We will call you if it shows the need to change or discontinue your antibiotic.    Follow up with your primary care provider if your symptoms are not improving.    

## 2021-08-11 NOTE — ED Triage Notes (Signed)
Pt here with suprapubic pressure and discomfort 2 weeks. Took a home UTI test and was positive.

## 2021-08-11 NOTE — ED Provider Notes (Signed)
[facility]    CSN: [id number] Arrival date & time: 08/11/21  1702      History   Chief Complaint Chief Complaint  Patient presents with   Dysuria     HPI Krista Harrison is a 60 y.o. female.  Patient presents with 2-week history of dysuria, bladder pressure, frequency.  She denies fever, chills, flank pain, vaginal discharge, pelvic pain, or other symptoms.  Treatment at home with [facility].  Her medical history includes hypertension, prediabetes, morbid obesity, GERD, anxiety, edema of lower extremities, factor V Leiden.  The history is provided by the patient and medical records.   Past Medical History:  Diagnosis Date   Anxiety    Edema of both lower extremities    Factor V Leiden ([place])    heterozygous   GERD (gastroesophageal reflux disease)    Hypertension     Patient Active Problem List   Diagnosis Date Noted   Morbid obesity (Krista Harrison) 10/28/2020   Prediabetes 10/26/2020   Vitamin D deficiency 10/26/2020   Pure hypercholesterolemia 04/04/2018   Rash, skin 04/04/2018   BRCA gene mutation negative 03/21/2017   Essential hypertension 09/25/2016   Gastroesophageal reflux disease 09/25/2016   Acute anxiety 09/25/2016    Past Surgical History:  Procedure Laterality Date   ABDOMINAL HYSTERECTOMY  2009   ROTATOR CUFF REPAIR Bilateral 2005, 2006    OB History     Gravida  3   Para  1   Term      Preterm      AB  2   Living         SAB  2   IAB      Ectopic      Multiple      Live Births           Obstetric Comments  1st Menstrual Cycle:  13  1st Pregnancy:  28           Home Medications    Prior to Admission medications   Medication Sig Start Date End Date Taking? Authorizing Provider  cephALEXin (KEFLEX) 500 MG capsule Take 1 capsule (500 mg total) by mouth 2 (two) times daily for 5 days. 08/11/21 08/16/21 Yes Sharion Balloon, NP  ALPRAZolam Duanne Moron) 0.25 MG tablet Take 1 tablet (0.25 mg total) by mouth daily as needed. 12/17/20    Juline Patch, MD  lisinopril-hydrochlorothiazide (ZESTORETIC) 10-12.5 MG tablet Take 1 tablet by mouth daily. 12/17/20   Juline Patch, MD  meloxicam (MOBIC) 15 MG tablet TAKE 1 TABLET BY MOUTH EVERY DAY 06/23/20   Juline Patch, MD  Semaglutide,0.25 or 0.5MG/DOS, (OZEMPIC, 0.25 OR 0.5 MG/DOSE,) 2 MG/1.5ML SOPN Inject 0.25 mg into the skin once a week. 12/21/20   Jearld Lesch A, DO  sertraline (ZOLOFT) 50 MG tablet Take 1 tablet (50 mg total) by mouth daily. 04/24/19 05/01/19  Juline Patch, MD    Family History Family History  Problem Relation Age of Onset   Cancer Mother        ovarian   Breast cancer Mother 45   COPD Father     Social History Social History   Tobacco Use   Smoking status: Never   Smokeless tobacco: Never  Vaping Use   Vaping Use: Never used  Substance Use Topics   Alcohol use: Yes    Alcohol/week: 0.0 standard drinks    Comment: socially   Drug use: No     Allergies   Patient has no  known allergies.   Review of Systems Review of Systems  Constitutional:  Negative for chills and fever.  Respiratory:  Negative for cough and shortness of breath.   Cardiovascular:  Negative for chest pain and palpitations.  Gastrointestinal:  Positive for abdominal pain. Negative for diarrhea and vomiting.  Genitourinary:  Positive for dysuria and frequency. Negative for flank pain, hematuria, pelvic pain and vaginal discharge.  Skin:  Negative for color change and rash.  All other systems reviewed and are negative.   Physical Exam Triage Vital Signs ED Triage Vitals  Enc Vitals Group     BP      Pulse      Resp      Temp      Temp src      SpO2      Weight      Height      Head Circumference      Peak Flow      Pain Score      Pain Loc      Pain Edu?      Excl. in [place]?    No data found.  Updated Vital Signs BP (!) 143/69   Pulse 92   Temp 98.1 F (36.7 C)   Resp 18   SpO2 97%   Visual Acuity Right Eye Distance:   Left Eye Distance:    Bilateral Distance:    Right Eye Near:   Left Eye Near:    Bilateral Near:     Physical Exam Vitals and nursing note reviewed.  Constitutional:      General: She is not in acute distress.    Appearance: She is well-developed.  HENT:     Head: Normocephalic and atraumatic.     Mouth/Throat:     Mouth: Mucous membranes are moist.  Eyes:     Conjunctiva/sclera: Conjunctivae normal.  Cardiovascular:     Rate and Rhythm: Normal rate and regular rhythm.     Heart sounds: Normal heart sounds.  Pulmonary:     Effort: Pulmonary effort is normal. No respiratory distress.     Breath sounds: Normal breath sounds.  Abdominal:     Palpations: Abdomen is soft.     Tenderness: There is no abdominal tenderness. There is no right CVA tenderness, left CVA tenderness, guarding or rebound.  Musculoskeletal:     Cervical back: Neck supple.  Skin:    General: Skin is warm and dry.  Neurological:     General: No focal deficit present.     Mental Status: She is alert and oriented to person, place, and time.     Gait: Gait normal.  Psychiatric:        Mood and Affect: Mood normal.        Behavior: Behavior normal.     UC Treatments / Results  Labs (all labs ordered are listed, but only abnormal results are displayed) Labs Reviewed  POCT URINALYSIS DIP (MANUAL ENTRY) - Abnormal; Notable for the following components:      Result Value   Clarity, UA cloudy (*)    Ketones, POC UA trace (5) (*)    Blood, UA small (*)    Protein Ur, POC trace (*)    Urobilinogen, UA 2.0 (*)    Nitrite, UA Positive (*)    Leukocytes, UA Small (1+) (*)    All other components within normal limits  URINE CULTURE    EKG   Radiology No results found.  Procedures Procedures (including critical care  time)  Medications Ordered in UC Medications - No data to display  Initial Impression / Assessment and Plan / UC Course  I have reviewed the triage vital signs and the nursing notes.  Pertinent labs &  imaging results that were available during my care of the patient were reviewed by me and considered in my medical decision making (see chart for details).   UTI with hematuria.  Treating with Keflex. Urine culture pending. Discussed with patient that we will call her if the urine culture shows the need to change or discontinue the antibiotic. Instructed her to follow-up with her PCP if her symptoms are not improving. Patient agrees to plan of care.      Final Clinical Impressions(s) / UC Diagnoses   Final diagnoses:  Urinary tract infection with hematuria, site unspecified     Discharge Instructions      Take the antibiotic as directed.  The urine culture is pending.  We will call you if it shows the need to change or discontinue your antibiotic.    Follow up with your primary care provider if your symptoms are not improving.         ED Prescriptions     Medication Sig Dispense Auth. Provider   cephALEXin (KEFLEX) 500 MG capsule Take 1 capsule (500 mg total) by mouth 2 (two) times daily for 5 days. 10 capsule Sharion Balloon, NP      PDMP not reviewed this encounter.   Sharion Balloon, NP 08/11/21 1747

## 2021-08-13 LAB — URINE CULTURE: Culture: 20000 — AB

## 2021-08-14 ENCOUNTER — Other Ambulatory Visit: Payer: Self-pay

## 2021-08-14 ENCOUNTER — Ambulatory Visit
Admission: EM | Admit: 2021-08-14 | Discharge: 2021-08-14 | Discharge status: Absent without leave | Payer: BC Managed Care – PPO

## 2021-08-14 ENCOUNTER — Encounter: Payer: Self-pay | Admitting: Emergency Medicine

## 2021-08-14 ENCOUNTER — Ambulatory Visit
Admission: EM | Admit: 2021-08-14 | Discharge: 2021-08-14 | Disposition: A | Discharge status: Home / Self Care | Payer: BC Managed Care – PPO | Attending: Physician Assistant | Admitting: Physician Assistant

## 2021-08-14 DIAGNOSIS — B9689 Other specified bacterial agents as the cause of diseases classified elsewhere: Secondary | ICD-10-CM

## 2021-08-14 DIAGNOSIS — N760 Acute vaginitis: Secondary | ICD-10-CM | POA: Diagnosis not present

## 2021-08-14 DIAGNOSIS — R3989 Other symptoms and signs involving the genitourinary system: Secondary | ICD-10-CM | POA: Diagnosis not present

## 2021-08-14 LAB — URINALYSIS, COMPLETE (UACMP) WITH MICROSCOPIC
Bilirubin Urine: NEGATIVE
Glucose, UA: NEGATIVE mg/dL
Ketones, ur: NEGATIVE mg/dL
Leukocytes,Ua: NEGATIVE
Nitrite: POSITIVE — AB
Protein, ur: NEGATIVE mg/dL
Specific Gravity, Urine: 1.025 (ref 1.005–1.030)
pH: 5.5 (ref 5.0–8.0)

## 2021-08-14 LAB — WET PREP, GENITAL
Sperm: NONE SEEN
Trich, Wet Prep: NONE SEEN
Yeast Wet Prep HPF POC: NONE SEEN

## 2021-08-14 MED ORDER — METRONIDAZOLE 500 MG PO TABS: 500.0000 mg | ORAL_TABLET | Freq: Two times a day (BID) | ORAL | 0 refills | Status: AC

## 2021-08-14 NOTE — ED Provider Notes (Signed)
[facility]    CSN: [id number] Arrival date & time: 08/14/21  1233      History   Chief Complaint Chief Complaint  Patient presents with   Abdominal Pain    HPI Krista Harrison is a 60 y.o. female presenting for greater than 2-week history of suprapubic pressure.  Patient also reports discomfort with urination but does not describe it as "burning."  She was seen at [facility] urgent care 3 days ago and thought to have a UTI.  She says she took Keflex and reports improvement in her symptoms.  Patient was called today and advised she could have a BV infection since the urine culture did not appear to be consistent with a UTI.  Patient returns today for vaginal swab.  She denies vaginal discharge, itching or odor.  She says that she actually has vaginal dryness.  Patient denies any associated fever, fatigue, nausea/vomiting/diarrhea or constipation.  No other complaints.  HPI  Past Medical History:  Diagnosis Date   Anxiety    Edema of both lower extremities    Factor V Leiden ([place])    heterozygous   GERD (gastroesophageal reflux disease)    Hypertension     Patient Active Problem List   Diagnosis Date Noted   Morbid obesity ([place]) 10/28/2020   Prediabetes 10/26/2020   Vitamin D deficiency 10/26/2020   Pure hypercholesterolemia 04/04/2018   Rash, skin 04/04/2018   BRCA gene mutation negative 03/21/2017   Essential hypertension 09/25/2016   Gastroesophageal reflux disease 09/25/2016   Acute anxiety 09/25/2016    Past Surgical History:  Procedure Laterality Date   ABDOMINAL HYSTERECTOMY  2009   ROTATOR CUFF REPAIR Bilateral 2005, 2006    OB History     Gravida  3   Para  1   Term      Preterm      AB  2   Living         SAB  2   IAB      Ectopic      Multiple      Live Births           Obstetric Comments  1st Menstrual Cycle:  13  1st Pregnancy:  28           Home Medications    Prior to Admission medications    Medication Sig Start Date End Date Taking? Authorizing Provider  esomeprazole (NEXIUM) 20 MG capsule Take by mouth.   Yes [provider]  lisinopril-hydrochlorothiazide (ZESTORETIC) 10-12.5 MG tablet Take 1 tablet by mouth daily. 12/17/20  Yes Juline Patch, MD  ALPRAZolam Duanne Moron) 0.25 MG tablet Take 1 tablet (0.25 mg total) by mouth daily as needed. 12/17/20   Juline Patch, MD  cephALEXin (KEFLEX) 500 MG capsule Take 1 capsule (500 mg total) by mouth 2 (two) times daily for 5 days. 08/11/21 08/16/21  Sharion Balloon, NP  meloxicam (MOBIC) 15 MG tablet TAKE 1 TABLET BY MOUTH EVERY DAY 06/23/20   Juline Patch, MD  Semaglutide,0.25 or 0.5MG/DOS, (OZEMPIC, 0.25 OR 0.5 MG/DOSE,) 2 MG/1.5ML SOPN Inject 0.25 mg into the skin once a week. 12/21/20   Jearld Lesch A, DO  sertraline (ZOLOFT) 50 MG tablet Take 1 tablet (50 mg total) by mouth daily. 04/24/19 05/01/19  Juline Patch, MD    Family History Family History  Problem Relation Age of Onset   Cancer Mother        ovarian   Breast cancer Mother  47   COPD Father     Social History Social History   Tobacco Use   Smoking status: Never   Smokeless tobacco: Never  Vaping Use   Vaping Use: Never used  Substance Use Topics   Alcohol use: Yes    Alcohol/week: 0.0 standard drinks    Comment: socially   Drug use: No     Allergies   Patient has no known allergies.   Review of Systems Review of Systems  Constitutional:  Negative for chills, fatigue and fever.  Gastrointestinal:  Positive for abdominal pain. Negative for diarrhea, nausea and vomiting.  Genitourinary:  Positive for dysuria. Negative for decreased urine volume, flank pain, frequency, hematuria, pelvic pain, urgency, vaginal bleeding, vaginal discharge and vaginal pain.  Musculoskeletal:  Negative for back pain.  Skin:  Negative for rash.    Physical Exam Triage Vital Signs ED Triage Vitals  Enc Vitals Group     BP 08/14/21 1303 135/70     Pulse Rate 08/14/21  1303 91     Resp 08/14/21 1303 14     Temp 08/14/21 1303 98.4 F (36.9 C)     Temp Source 08/14/21 1303 Oral     SpO2 08/14/21 1303 96 %     Weight 08/14/21 1259 255 lb 1.2 oz (115.7 kg)     Height 08/14/21 1259 '5\' 1"'  (1.549 m)     Head Circumference --      Peak Flow --      Pain Score 08/14/21 1259 5     Pain Loc --      Pain Edu? --      Excl. in [place]? --    No data found.  Updated Vital Signs BP 135/70 (BP Location: Left Arm)   Pulse 91   Temp 98.4 F (36.9 C) (Oral)   Resp 14   Ht '5\' 1"'  (1.549 m)   Wt 255 lb 1.2 oz (115.7 kg)   SpO2 96%   BMI 48.20 kg/m    Physical Exam Vitals and nursing note reviewed.  Constitutional:      General: She is not in acute distress.    Appearance: Normal appearance. She is well-developed. She is obese. She is not ill-appearing or toxic-appearing.  HENT:     Head: Normocephalic and atraumatic.  Eyes:     General: No scleral icterus.       Right eye: No discharge.        Left eye: No discharge.     Conjunctiva/sclera: Conjunctivae normal.  Cardiovascular:     Rate and Rhythm: Normal rate and regular rhythm.     Heart sounds: Normal heart sounds.  Pulmonary:     Effort: Pulmonary effort is normal. No respiratory distress.     Breath sounds: Normal breath sounds.  Abdominal:     Palpations: Abdomen is soft.     Tenderness: There is generalized abdominal tenderness. There is no right CVA tenderness or left CVA tenderness.  Musculoskeletal:     Cervical back: Neck supple.  Skin:    General: Skin is dry.  Neurological:     General: No focal deficit present.     Mental Status: She is alert. Mental status is at baseline.     Motor: No weakness.     Gait: Gait normal.  Psychiatric:        Mood and Affect: Mood normal.        Behavior: Behavior normal.        Thought Content: Thought  content normal.     UC Treatments / Results  Labs (all labs ordered are listed, but only abnormal results are displayed) Labs Reviewed  WET  PREP, GENITAL - Abnormal; Notable for the following components:      Result Value   Clue Cells Wet Prep HPF POC PRESENT (*)    WBC, Wet Prep HPF POC FEW (*)    All other components within normal limits  URINALYSIS, COMPLETE (UACMP) WITH MICROSCOPIC    EKG   Radiology No results found.  Procedures Procedures (including critical care time)  Medications Ordered in UC Medications - No data to display  Initial Impression / Assessment and Plan / UC Course  I have reviewed the triage vital signs and the nursing notes.  Pertinent labs & imaging results that were available during my care of the patient were reviewed by me and considered in my medical decision making (see chart for details).   60 year old female presenting for 2.5-week history of bladder pressure and abdominal discomfort.  Patient seen at [facility] urgent care 3 days ago and treated for possible UTI with Keflex.  Patient reported improvement in her symptoms but is advised today discontinue the antibiotic based on the urine culture.  Patient returns for vaginal swab at request of nursing staff.  Vitals normal and stable today.  She is overall well-appearing.  Exam does reveal generalized abdominal tenderness that appears to be mild.  No rebound or guarding.  Wet prep positive for clue cells.  UA obtained today shows trace hemoglobin and positive nitrites.  We will reculture urine, but patient to continue Keflex at this time since she thinks that is helped her.  Advised her urine culture result be back in a couple of days.  Advised patient that we will treat her for BV.  Started metronidazole at this time.  Advised increasing rest and fluids.  Reviewed following up with PCP if symptoms seem to continue after she completes the metronidazole or if they worsen.  ED precautions regarding abdominal pain reviewed with patient.  Final Clinical Impressions(s) / UC Diagnoses   Final diagnoses:  Bacterial vaginosis  Sensation of  pressure in bladder area     Discharge Instructions      -You do have a BV infection.  This could be the cause of your symptoms.  I have sent metronidazole to the pharmacy.  Take full course.  Do not drink alcohol with it as it can upset your stomach. -We have obtained another urine sample and we will culture it to make sure there is no bacteria in there.  If you think the Keflex is helped you just finish it out.  We will call you in a couple days with the new urine culture results. -If you continue to have symptoms after you are treated for the BV infection you should follow-up with your primary care provider. - If you have any acute worsening of your symptoms such as worsening abdominal pain, fever, blood in the urine, etc., please go to emergency department.     ED Prescriptions   None    PDMP not reviewed this encounter.   Danton Clap, PA-C 08/14/21 [phone]

## 2021-08-14 NOTE — ED Triage Notes (Signed)
Patient c/o lower abdominal pressure that started a week ago.  Patient was seen at [facility] on 10/27 and was thought to have a UTI but was told to come to have a vaginal swab done today.  Patient denies vaginal discharge. Patient was also told to stop the antibiotic.

## 2021-08-14 NOTE — Discharge Instructions (Signed)
-  You do have a BV infection.  This could be the cause of your symptoms.  I have sent metronidazole to the pharmacy.  Take full course.  Do not drink alcohol with it as it can upset your stomach. -We have obtained another urine sample and we will culture it to make sure there is no bacteria in there.  If you think the Keflex is helped you just finish it out.  We will call you in a couple days with the new urine culture results. -If you continue to have symptoms after you are treated for the BV infection you should follow-up with your primary care provider. - If you have any acute worsening of your symptoms such as worsening abdominal pain, fever, blood in the urine, etc., please go to emergency department.

## 2021-08-15 LAB — URINE CULTURE: Culture: <10000 — AB

## 2021-08-25 ENCOUNTER — Encounter: Payer: BC Managed Care – PPO | Admitting: Family Medicine

## 2021-09-05 ENCOUNTER — Telehealth: Payer: BC Managed Care – PPO | Admitting: Physician Assistant

## 2021-09-05 DIAGNOSIS — R3989 Other symptoms and signs involving the genitourinary system: Secondary | ICD-10-CM

## 2021-09-05 DIAGNOSIS — N760 Acute vaginitis: Secondary | ICD-10-CM | POA: Diagnosis not present

## 2021-09-05 DIAGNOSIS — B9689 Other specified bacterial agents as the cause of diseases classified elsewhere: Secondary | ICD-10-CM

## 2021-09-05 MED ORDER — METRONIDAZOLE 500 MG PO TABS: 500.0000 mg | ORAL_TABLET | Freq: Two times a day (BID) | ORAL | 0 refills | Status: AC

## 2021-09-05 MED ORDER — SULFAMETHOXAZOLE-TRIMETHOPRIM 800-160 MG PO TABS: 1.0000 | ORAL_TABLET | Freq: Two times a day (BID) | ORAL | 0 refills | Status: DC

## 2021-09-05 NOTE — Progress Notes (Signed)
Virtual Visit Consent   Krista Harrison, you are scheduled for a virtual visit with a [facility] provider today.     Just as with appointments in the office, your consent must be obtained to participate.  Your consent will be active for this visit and any virtual visit you may have with one of our providers in the next 365 days.     If you have a MyChart account, a copy of this consent can be sent to you electronically.  All virtual visits are billed to your insurance company just like a traditional visit in the office.    As this is a virtual visit, video technology does not allow for your provider to perform a traditional examination.  This may limit your provider's ability to fully assess your condition.  If your provider identifies any concerns that need to be evaluated in person or the need to arrange testing (such as labs, EKG, etc.), we will make arrangements to do so.     Although advances in technology are sophisticated, we cannot ensure that it will always work on either your end or our end.  If the connection with a video visit is poor, the visit may have to be switched to a telephone visit.  With either a video or telephone visit, we are not always able to ensure that we have a secure connection.     I need to obtain your verbal consent now.   Are you willing to proceed with your visit today?    Krista Harrison has provided verbal consent on 09/05/2021 for a virtual visit (video or telephone).   Mar Daring, PA-C   Date: 09/05/2021 10:37 AM   Virtual Visit via Video Note   I, Mar Daring, connected with  Krista Harrison  ([id number], Feb 17, 1961) on 09/05/21 at 10:15 AM EST by a video-enabled telemedicine application and verified that I am speaking with the correct person using two identifiers.  Location: Patient: Virtual Visit Location Patient: Other: work;isolated Provider: Virtual Visit Location Provider: Home Office   I discussed the limitations of  evaluation and management by telemedicine and the availability of in person appointments. The patient expressed understanding and agreed to proceed.    History of Present Illness: Krista Harrison is a 60 y.o. who identifies as a female who was assigned female at birth, and is being seen today for vaginal irritation and pelvic pressure. She was seen at [facility] on 08/11/21 and was treated with Keflex for possible UTI. Then on 08/14/21 was treated with Metronidazole for BV. She had been told to stop Keflex but never did, she completed therapy for both. She reports metronidazole helped some but symptoms never completely resolved. She denies any dysuria, but does have foul smelling urine. Denies vaginal discharge or fishy odor. Does report just having a dull, aching pressure in lower abdomen. Denies fevers, chills, nausea, vomiting.   Problems:  Patient Active Problem List   Diagnosis Date Noted   Morbid obesity ([place]) 10/28/2020   Prediabetes 10/26/2020   Vitamin D deficiency 10/26/2020   Pure hypercholesterolemia 04/04/2018   Rash, skin 04/04/2018   BRCA gene mutation negative 03/21/2017   Essential hypertension 09/25/2016   Gastroesophageal reflux disease 09/25/2016   Acute anxiety 09/25/2016    Allergies: No Known Allergies Medications:  Current Outpatient Medications:    metroNIDAZOLE (FLAGYL) 500 MG tablet, Take 1 tablet (500 mg total) by mouth 2 (two) times daily for 7 days., Disp: 14 tablet, Rfl: 0  sulfamethoxazole-trimethoprim (BACTRIM DS) 800-160 MG tablet, Take 1 tablet by mouth 2 (two) times daily., Disp: 14 tablet, Rfl: 0   ALPRAZolam (XANAX) 0.25 MG tablet, Take 1 tablet (0.25 mg total) by mouth daily as needed., Disp: 30 tablet, Rfl: 5   esomeprazole (NEXIUM) 20 MG capsule, Take by mouth., Disp: , Rfl:    lisinopril-hydrochlorothiazide (ZESTORETIC) 10-12.5 MG tablet, Take 1 tablet by mouth daily., Disp: 90 tablet, Rfl: 1   meloxicam (MOBIC) 15 MG tablet, TAKE 1 TABLET BY MOUTH EVERY  DAY, Disp: 30 tablet, Rfl: 3   Semaglutide,0.25 or 0.5MG/DOS, (OZEMPIC, 0.25 OR 0.5 MG/DOSE,) 2 MG/1.5ML SOPN, Inject 0.25 mg into the skin once a week., Disp: 1.5 mL, Rfl: 0  Observations/Objective: Patient is well-developed, well-nourished in no acute distress.  Resting comfortably  Head is normocephalic, atraumatic.  No labored breathing.  Speech is clear and coherent with logical content.  Patient is alert and oriented at baseline.    Assessment and Plan: 1. BV (bacterial vaginosis) - metroNIDAZOLE (FLAGYL) 500 MG tablet; Take 1 tablet (500 mg total) by mouth 2 (two) times daily for 7 days.  Dispense: 14 tablet; Refill: 0  2. Suspected UTI - sulfamethoxazole-trimethoprim (BACTRIM DS) 800-160 MG tablet; Take 1 tablet by mouth 2 (two) times daily.  Dispense: 14 tablet; Refill: 0  - Will give Metronidazole for BV and Bactrim for UTI - Advised should start Probiotic to protect stomach with so many antibiotics in short period of time - Discussed that her symptoms may not be infectious at all and that she may just have atrophic vaginitis secondary to menopause - She does have appt with PCP on 09/20/21 where she will discuss further  Follow Up Instructions: I discussed the assessment and treatment plan with the patient. The patient was provided an opportunity to ask questions and all were answered. The patient agreed with the plan and demonstrated an understanding of the instructions.  A copy of instructions were sent to the patient via MyChart unless otherwise noted below.    The patient was advised to call back or seek an in-person evaluation if the symptoms worsen or if the condition fails to improve as anticipated.  Time:  I spent 15 minutes with the patient via telehealth technology discussing the above problems/concerns.    Mar Daring, PA-C

## 2021-09-05 NOTE — Patient Instructions (Signed)
Krista Harrison, thank you for joining Mar Daring, PA-C for today's virtual visit.  While this provider is not your primary care provider (PCP), if your PCP is located in our provider database this encounter information will be shared with them immediately following your visit.  Consent: (Patient) Krista Harrison provided verbal consent for this virtual visit at the beginning of the encounter.  Current Medications:  Current Outpatient Medications:    metroNIDAZOLE (FLAGYL) 500 MG tablet, Take 1 tablet (500 mg total) by mouth 2 (two) times daily for 7 days., Disp: 14 tablet, Rfl: 0   sulfamethoxazole-trimethoprim (BACTRIM DS) 800-160 MG tablet, Take 1 tablet by mouth 2 (two) times daily., Disp: 14 tablet, Rfl: 0   ALPRAZolam (XANAX) 0.25 MG tablet, Take 1 tablet (0.25 mg total) by mouth daily as needed., Disp: 30 tablet, Rfl: 5   esomeprazole (NEXIUM) 20 MG capsule, Take by mouth., Disp: , Rfl:    lisinopril-hydrochlorothiazide (ZESTORETIC) 10-12.5 MG tablet, Take 1 tablet by mouth daily., Disp: 90 tablet, Rfl: 1   meloxicam (MOBIC) 15 MG tablet, TAKE 1 TABLET BY MOUTH EVERY DAY, Disp: 30 tablet, Rfl: 3   Semaglutide,0.25 or 0.5MG /DOS, (OZEMPIC, 0.25 OR 0.5 MG/DOSE,) 2 MG/1.5ML SOPN, Inject 0.25 mg into the skin once a week., Disp: 1.5 mL, Rfl: 0   Medications ordered in this encounter:  Meds ordered this encounter  Medications   metroNIDAZOLE (FLAGYL) 500 MG tablet    Sig: Take 1 tablet (500 mg total) by mouth 2 (two) times daily for 7 days.    Dispense:  14 tablet    Refill:  0    Order Specific Question:   Supervising Provider    Answer:   Sabra Heck, BRIAN [[id number]]   sulfamethoxazole-trimethoprim (BACTRIM DS) 800-160 MG tablet    Sig: Take 1 tablet by mouth 2 (two) times daily.    Dispense:  14 tablet    Refill:  0    Order Specific Question:   Supervising Provider    Answer:   Sabra Heck, BRIAN [[id number]]     *If you need refills on other medications prior to your next  appointment, please contact your pharmacy*  Follow-Up: Call back or seek an in-person evaluation if the symptoms worsen or if the condition fails to improve as anticipated.  Other Instructions Antibiotic Medicine, Adult Antibiotic medicines are used to treat infections caused by bacteria, such as strep throat and urinary tract infection (UTI). Antibiotic medicines will not work for colds, the flu (influenza), or other illnesses caused by viruses. These medicines work by killing the bacteria that are making you sick. Antibiotics can also have serious side effects. It is important that you take antibiotic medicines safely and only when needed. When do I need to take antibiotics? You may need antibiotics for: UTI. Strep throat. Bacterial sinusitis. Meningitis. This infection affects the spinal cord and brain. Serious lung infection. You may start antibiotics while your health care provider waits for your results from any tests for possible infection. Tests may include a culture of your throat, urine, blood, or mucus. Your health care provider may change or stop your antibiotic depending on your test results. When are antibiotics not needed? You do not need antibiotics for most common illnesses. These illnesses may be caused by a virus, not by bacteria. You do not need antibiotics for: The common cold. Influenza. Sore throat. Discolored mucus. Bronchitis. Antibiotics are not always needed for all infections caused by bacteria. Many of these infections clear up without antibiotic treatment.  Do not ask for or take antibiotics when they are not necessary. How long should I take my antibiotic? You must take the entire prescription. Continue to take your antibiotic for as long as told by your health care provider. Do not stop taking it even if you start to feel better. If you stop taking it too soon: You may start to feel sick again. Your infection may become harder to treat. Each course of  antibiotics needs a different amount of time to work. Some antibiotic courses last only a few days. Some last about a week to 10 days. In some cases, you may need to take antibiotics for a few weeks to completely treat your infection. What if I miss a dose? Try not to miss any doses of medicine. If you miss a dose, call your health care provider or pharmacist for advice. Sometimes it is okay to take the missed dose as soon as possible. Do not take double or extra doses. What are the risks of taking antibiotics? Antibiotics can cause: Allergic reactions. Nausea. Yeast infections. Liver problems. Antibiotics can also cause an infection called Clostridioides difficile (C. difficile or C. diff), which causes severe diarrhea. This infection happens when the antibiotics kill the healthy bacteria in your intestines. This allows C. diff to grow. C. diff needs to be treated right away. Let your health care provider know if: You develop diarrhea while taking an antibiotic. You develop diarrhea after you stop taking an antibiotic. C. diff infection can start weeks after stopping the antibiotic. Taking an antibiotic also puts you at risk for getting sick in the future with bacteria that do not respond to medicine (antibiotic-resistant infection). Antibiotics can cause bacteria to change so that if the antibiotic is taken again, the medicine cannot kill the bacteria. These infections can be more serious and, in some cases, life-threatening. Do antibiotics affect birth control? Birth control pills may not work while you are on antibiotics. If you are taking birth control pills, continue taking them as usual and use a second form of birth control, such as a condom, to avoid unwanted pregnancy. Continue using the second form of birth control until your health care provider says you can stop. What else should I know about taking antibiotics? It is important for you to take antibiotics exactly as told. Make sure  to: Take the correct amount of medicine at the same time each day. Ask your health care provider: How long to wait between doses. If your antibiotic should be taken with food. If there are any foods, drinks, or medicines that you should avoid while taking your antibiotics. If there are any side effects you should be aware of. Use only the antibiotics prescribed for you by your health care provider. Do not use antibiotics prescribed for someone else. Drink a large glass of water when taking your antibiotics. Drink enough fluid to keep your urine pale yellow. Ask your pharmacist for a syringe, cup, or spoon that properly measures your antibiotics. Throw away any leftover medicine. Follow these instructions at home: Take over-the-counter and prescription medicines as told by your health care provider. Return to your normal activities as told by your health care provider. Ask your health care provider what activities are safe for you. Keep all follow-up visits as told by your health care provider. This is important. Contact a health care provider if: Your symptoms get worse. You have new joint pain or muscle aches that begin after starting your antibiotic. You have side effects  from your antibiotic, such as: Stomach pain. Diarrhea. Nausea. White patches in your mouth or throat. Get help right away if: You have signs of a severe allergic reaction to antibiotics. If you have any of these signs, stop taking the antibiotic right away. Signs may include: Hives. These are raised, itchy, red bumps on your skin. Skin rash. Trouble breathing. Noisy breathing (wheezing). Swelling anywhere on your body. Feeling dizzy. Vomiting. You have signs of liver problems, such as: Dark or blood-colored urine. Yellow color to your skin. Bruising or bleeding easily. You have severe diarrhea, and you have cramps in your abdomen. You have a severe headache. These symptoms may represent a serious problem that  is an emergency. Do not wait to see if the symptoms will go away. Get medical help right away. Call your local emergency services (911 in the [place] Do not drive yourself to the hospital. Summary Antibiotic medicines are used to treat infections caused by bacteria. It is important that you take antibiotic medicines safely and only when needed. Your health care provider may change or stop your antibiotic depending on your results from certain tests. Finish all antibiotic medicine even when you start to feel better. This information is not intended to replace advice given to you by your health care provider. Make sure you discuss any questions you have with your health care provider. Document Revised: 11/17/2019 Document Reviewed: 07/22/2019 Elsevier Patient Education  2022 [employer].  Atrophic Vaginitis Atrophic vaginitis is a condition in which the tissues that line the vagina become dry and thin. This condition is most common in women who have stopped having regular menstrual periods (are in menopause). This usually starts when a woman is 46 to 60 years old. That is the time when a woman's estrogen levels begin to decrease. Estrogen is a female hormone. It helps to keep the tissues of the vagina moist. It stimulates the vagina to produce a clear fluid that lubricates the vagina for sex. This fluid also protects the vagina from infection. Lack of estrogen can cause the lining of the vagina to get thinner and dryer. The vagina may also shrink in size. It may become less elastic. Atrophic vaginitis tends to get worse over time as a woman's estrogen level drops. What are the causes? This condition is caused by the normal drop in estrogen that happens around the time of menopause. What increases the risk? Certain conditions or situations may lower a woman's estrogen level, leading to a higher risk for atrophic vaginitis. You are more likely to develop this condition if: You are taking medicines that  block estrogen. You have had your ovaries removed. You are being treated for cancer with radiation or medicines (chemotherapy). You have given birth or are breastfeeding. You are older than age 53. You smoke. What are the signs or symptoms? Symptoms of this condition include: Pain, soreness, a feeling of pressure, or bleeding during sex (dyspareunia). Vaginal burning, irritation, or itching. Pain or bleeding when a speculum is used in a vaginal exam. Having burning pain while urinating. Vaginal discharge. In some cases, there are no symptoms. How is this diagnosed? This condition is diagnosed based on your medical history and a physical exam. This will include a pelvic exam that checks the vaginal tissues. Though rare, you may also have other tests, including: A urine test. A test that checks the acid balance in your vagina (acid balance test). How is this treated? Treatment for this condition depends on how severe your symptoms are.  Treatment may include: Using an over-the-counter vaginal lubricant before sex. Using a long-acting vaginal moisturizer. Using low-dose estrogen for moderate to severe symptoms that do not respond to other treatments. Options include creams, tablets, and inserts (vaginal rings). Before you use a vaginal estrogen, tell your health care provider if you have a history of: Breast cancer. Endometrial cancer. Blood clots. If you are not sexually active and your symptoms are very mild, you may not need treatment. Follow these instructions at home: Medicines Take over-the-counter and prescription medicines only as told by your health care provider. Do not use herbal or alternative medicines unless your health care provider says that you can. Use over-the-counter creams, lubricants, or moisturizers for dryness only as told by your health care provider. General instructions If your atrophic vaginitis is caused by menopause, discuss all of your menopause symptoms  and treatment options with your health care provider. Do not douche. Do not use products that can make your vagina dry. These include: Scented feminine sprays. Scented tampons. Scented soaps. Vaginal sex can help to improve blood flow and elasticity of vaginal tissue. If you choose to have sex and it hurts, try using a water-soluble lubricant or moisturizer right before having sex. Contact a health care provider if: Your discharge looks different than normal. Your vagina has an unusual smell. You have new symptoms. Your symptoms do not improve with treatment. Your symptoms get worse. Summary Atrophic vaginitis is a condition in which the tissues that line the vagina become dry and thin. It is most common in women who have stopped having regular menstrual periods (are in menopause). Treatment options include using vaginal lubricants and low-dose vaginal estrogen. Contact a health care provider if your vagina has an unusual smell, or if your symptoms get worse or do not improve after treatment. This information is not intended to replace advice given to you by your health care provider. Make sure you discuss any questions you have with your health care provider. Document Revised: 04/01/2020 Document Reviewed: 04/01/2020 Elsevier Patient Education  2022 [employer].     If you have been instructed to have an in-person evaluation today at a local Urgent Care facility, please use the link below. It will take you to a list of all of our available [facility], including address, phone number and hours of operation. Please do not delay care.  [facility]  If you or a family member do not have a primary care provider, use the link below to schedule a visit and establish care. When you choose a [facility] primary care physician or advanced practice provider, you gain a long-term partner in health. Find a Primary Care Provider  Learn more about [facility] in-office  and virtual care options: [facility]

## 2021-09-06 ENCOUNTER — Ambulatory Visit: Payer: Self-pay

## 2021-09-19 ENCOUNTER — Telehealth: Payer: Self-pay

## 2021-09-19 NOTE — Telephone Encounter (Signed)
Called pt and scheduled appt with [facility] for her on 10/26/20 @ 2:30- they are also placing her on a cancellation list

## 2021-09-19 NOTE — Telephone Encounter (Signed)
Follow up with phone call for pap

## 2021-09-20 ENCOUNTER — Ambulatory Visit: Payer: BC Managed Care – PPO | Admitting: Family Medicine

## 2021-09-20 ENCOUNTER — Telehealth: Payer: Self-pay | Admitting: Family Medicine

## 2021-09-20 NOTE — Telephone Encounter (Signed)
[facility] called and stated patient now has covid, and need to be a mychart vv today. I called patient back to inform her that she has to be seen, so an appointment was made for to come in December 15th.

## 2021-09-29 ENCOUNTER — Ambulatory Visit: Payer: BC Managed Care – PPO | Admitting: Family Medicine

## 2021-09-30 ENCOUNTER — Ambulatory Visit
Admission: EM | Admit: 2021-09-30 | Discharge: 2021-09-30 | Disposition: A | Discharge status: Home / Self Care | Payer: BC Managed Care – PPO | Attending: Internal Medicine | Admitting: Internal Medicine

## 2021-09-30 ENCOUNTER — Ambulatory Visit (INDEPENDENT_AMBULATORY_CARE_PROVIDER_SITE_OTHER): Payer: BC Managed Care – PPO

## 2021-09-30 DIAGNOSIS — N3001 Acute cystitis with hematuria: Secondary | ICD-10-CM | POA: Diagnosis not present

## 2021-09-30 DIAGNOSIS — R0602 Shortness of breath: Secondary | ICD-10-CM | POA: Insufficient documentation

## 2021-09-30 LAB — POCT URINALYSIS DIP (MANUAL ENTRY)
Bilirubin, UA: NEGATIVE
Glucose, UA: NEGATIVE mg/dL
Ketones, POC UA: NEGATIVE mg/dL
Nitrite, UA: POSITIVE — AB
Protein Ur, POC: NEGATIVE mg/dL
Spec Grav, UA: 1.02 (ref 1.010–1.025)
Urobilinogen, UA: 0.2 E.U./dL
pH, UA: 6 (ref 5.0–8.0)

## 2021-09-30 MED ORDER — NITROFURANTOIN MONOHYD MACRO 100 MG PO CAPS: 100.0000 mg | ORAL_CAPSULE | Freq: Two times a day (BID) | ORAL | 0 refills | Status: DC

## 2021-09-30 MED ORDER — ALBUTEROL SULFATE HFA 108 (90 BASE) MCG/ACT IN AERS: 1.0000 | INHALATION_SPRAY | Freq: Four times a day (QID) | RESPIRATORY_TRACT | 0 refills | Status: DC | PRN

## 2021-09-30 NOTE — ED Provider Notes (Signed)
[facility]    CSN: [id number] Arrival date & time: 09/30/21  1406      History   Chief Complaint Chief Complaint  Patient presents with   Shortness of Breath   Dysuria    HPI Krista Harrison is a 60 y.o. female.   Patient presents for further evaluation after testing positive for COVID with an at home test on 09/17/2021.  Patient reports that most of her COVID symptoms have resolved but she developed shortness of breath today.  She has also felt very fatigued since COVID diagnosis.  She reports that she was seen by video visit and prescribed azithromycin and prednisone steroid which she completed a few days ago.  Shortness of breath only occurs with exertion.  Denies any chest pain, upper respiratory symptoms, cough, fever, nausea, vomiting, diarrhea, abdominal pain, sore throat, ear pain.  Patient has taken Mucinex for symptoms with minimal improvement.  Denies any history of chronic lung diseases such as asthma or COPD and patient is not a smoker.  Patient also is concerned for urinary tract infection as she has been having urinary burning and urinary frequency that started yesterday.  Patient reports that she had some leftover antibiotics from a previous UTI and took them for 3 days with improvement in symptoms.  She has also been taking Azo.  Denies any vaginal discharge, hematuria, pelvic pain, abdominal pain, fever, back pain.   Shortness of Breath Dysuria  Past Medical History:  Diagnosis Date   Anxiety    Edema of both lower extremities    Factor V Leiden ([place])    heterozygous   GERD (gastroesophageal reflux disease)    Hypertension     Patient Active Problem List   Diagnosis Date Noted   Morbid obesity ([place]) 10/28/2020   Prediabetes 10/26/2020   Vitamin D deficiency 10/26/2020   Pure hypercholesterolemia 04/04/2018   Rash, skin 04/04/2018   BRCA gene mutation negative 03/21/2017   Essential hypertension 09/25/2016   Gastroesophageal reflux disease  09/25/2016   Acute anxiety 09/25/2016    Past Surgical History:  Procedure Laterality Date   ABDOMINAL HYSTERECTOMY  2009   ROTATOR CUFF REPAIR Bilateral 2005, 2006    OB History     Gravida  3   Para  1   Term      Preterm      AB  2   Living         SAB  2   IAB      Ectopic      Multiple      Live Births           Obstetric Comments  1st Menstrual Cycle:  13  1st Pregnancy:  28           Home Medications    Prior to Admission medications   Medication Sig Start Date End Date Taking? Authorizing Provider  albuterol (VENTOLIN HFA) 108 (90 Base) MCG/ACT inhaler Inhale 1-2 puffs into the lungs every 6 (six) hours as needed for wheezing or shortness of breath. 09/30/21  Yes Damir Leung, Hildred Alamin E, FNP  nitrofurantoin, macrocrystal-monohydrate, (MACROBID) 100 MG capsule Take 1 capsule (100 mg total) by mouth 2 (two) times daily. 09/30/21  Yes Skylah Delauter, Michele Rockers, FNP  ALPRAZolam Duanne Moron) 0.25 MG tablet Take 1 tablet (0.25 mg total) by mouth daily as needed. 12/17/20   Juline Patch, MD  esomeprazole (NEXIUM) 20 MG capsule Take by mouth.    [provider]  lisinopril-hydrochlorothiazide (ZESTORETIC) 10-12.5  MG tablet Take 1 tablet by mouth daily. 12/17/20   Juline Patch, MD  meloxicam (MOBIC) 15 MG tablet TAKE 1 TABLET BY MOUTH EVERY DAY 06/23/20   Juline Patch, MD  Semaglutide,0.25 or 0.5MG/DOS, (OZEMPIC, 0.25 OR 0.5 MG/DOSE,) 2 MG/1.5ML SOPN Inject 0.25 mg into the skin once a week. 12/21/20   Jearld Lesch A, DO  sulfamethoxazole-trimethoprim (BACTRIM DS) 800-160 MG tablet Take 1 tablet by mouth 2 (two) times daily. 09/05/21   Mar Daring, PA-C  sertraline (ZOLOFT) 50 MG tablet Take 1 tablet (50 mg total) by mouth daily. 04/24/19 05/01/19  Juline Patch, MD    Family History Family History  Problem Relation Age of Onset   Cancer Mother        ovarian   Breast cancer Mother 26   COPD Father     Social History Social History   Tobacco Use    Smoking status: Never   Smokeless tobacco: Never  Vaping Use   Vaping Use: Never used  Substance Use Topics   Alcohol use: Yes    Alcohol/week: 0.0 standard drinks    Comment: socially   Drug use: No     Allergies   Patient has no known allergies.   Review of Systems Review of Systems Per HPI  Physical Exam Triage Vital Signs ED Triage Vitals  Enc Vitals Group     BP 09/30/21 1455 (!) 142/87     Pulse Rate 09/30/21 1455 (!) 114     Resp 09/30/21 1455 18     Temp 09/30/21 1455 98.4 F (36.9 C)     Temp Source 09/30/21 1455 Oral     SpO2 09/30/21 1455 94 %     Weight --      Height --      Head Circumference --      Peak Flow --      Pain Score 09/30/21 1459 0     Pain Loc --      Pain Edu? --      Excl. in [place]? --    No data found.  Updated Vital Signs BP 131/88 (BP Location: Left Arm)    Pulse 100    Temp 98.4 F (36.9 C) (Oral)    Resp 18    SpO2 94%   Visual Acuity Right Eye Distance:   Left Eye Distance:   Bilateral Distance:    Right Eye Near:   Left Eye Near:    Bilateral Near:     Physical Exam Constitutional:      General: She is not in acute distress.    Appearance: Normal appearance. She is not toxic-appearing or diaphoretic.  HENT:     Head: Normocephalic and atraumatic.     Right Ear: Tympanic membrane and ear canal normal.     Left Ear: Tympanic membrane and ear canal normal.     Nose: Nose normal.     Mouth/Throat:     Mouth: Mucous membranes are moist.     Pharynx: No posterior oropharyngeal erythema.  Eyes:     Extraocular Movements: Extraocular movements intact.     Conjunctiva/sclera: Conjunctivae normal.     Pupils: Pupils are equal, round, and reactive to light.  Cardiovascular:     Rate and Rhythm: Normal rate and regular rhythm.     Pulses: Normal pulses.     Heart sounds: Normal heart sounds.  Pulmonary:     Effort: Pulmonary effort is normal. No respiratory distress.  Breath sounds: Normal breath sounds.   Abdominal:     General: Bowel sounds are normal. There is no distension.     Palpations: Abdomen is soft.     Tenderness: There is no abdominal tenderness.  Skin:    General: Skin is warm and dry.  Neurological:     General: No focal deficit present.     Mental Status: She is alert and oriented to person, place, and time. Mental status is at baseline.  Psychiatric:        Mood and Affect: Mood normal.        Behavior: Behavior normal.        Thought Content: Thought content normal.        Judgment: Judgment normal.     UC Treatments / Results  Labs (all labs ordered are listed, but only abnormal results are displayed) Labs Reviewed  POCT URINALYSIS DIP (MANUAL ENTRY) - Abnormal; Notable for the following components:      Result Value   Blood, UA trace-intact (*)    Nitrite, UA Positive (*)    Leukocytes, UA Trace (*)    All other components within normal limits  URINE CULTURE    EKG   Radiology DG Chest 2 View  Result Date: 09/30/2021 CLINICAL DATA:  Shortness of breath EXAM: CHEST - 2 VIEW COMPARISON:  Chest radiograph 05/25/2021 FINDINGS: The cardiomediastinal silhouette is normal. There is no focal consolidation or pulmonary edema. There is no pleural effusion or pneumothorax. There is no acute osseous abnormality. IMPRESSION: No radiographic evidence of acute cardiopulmonary process. Electronically Signed   By: Valetta Mole M.D.   On: 09/30/2021 15:45    Procedures Procedures (including critical care time)  Medications Ordered in UC Medications - No data to display  Initial Impression / Assessment and Plan / UC Course  I have reviewed the triage vital signs and the nursing notes.  Pertinent labs & imaging results that were available during my care of the patient were reviewed by me and considered in my medical decision making (see chart for details).     Chest x-ray was negative for any acute cardiopulmonary process.  Will prescribe albuterol inhaler as  patient has previously been on prednisone steroid a few days prior.  Lung sounds are clear and no signs of respiratory compromise on exam.  No signs of bacterial infection on exam so do not think that antibiotic therapy is necessary at this time.  Do not think the patient is in need of immediate medical attention at the hospital at this time.  Although, patient was advised to go to the hospital if symptoms do not improve or worsen.  Urinalysis showing signs of urinary tract infection.  After review of chart, patient was seen by video visit and prescribed Bactrim on 09/05/2021 for urinary tract infection.  Will prescribe Macrobid x5 days for this UTI.  Urine culture is pending.  Discussed strict return precautions.  Patient verbalized understanding and was agreeable with plan. Final Clinical Impressions(s) / UC Diagnoses   Final diagnoses:  Acute cystitis with hematuria  Shortness of breath     Discharge Instructions      Your chest x-ray was normal.  You have been prescribed an albuterol inhaler to take as needed for shortness of breath.  Please go to the hospital if your shortness of breath worsens or does not improve.  It also appears that you have a urinary tract infection.  You have been prescribed an antibiotic to help treat this.  Urine culture is pending.  We will call if there are any abnormalities.     ED Prescriptions     Medication Sig Dispense Auth. Provider   albuterol (VENTOLIN HFA) 108 (90 Base) MCG/ACT inhaler Inhale 1-2 puffs into the lungs every 6 (six) hours as needed for wheezing or shortness of breath. 1 each [place], Farner E, [place]   nitrofurantoin, macrocrystal-monohydrate, (MACROBID) 100 MG capsule Take 1 capsule (100 mg total) by mouth 2 (two) times daily. 10 capsule Teodora Medici, [place]      PDMP not reviewed this encounter.   Teodora Medici, [place] 09/30/21 [phone]

## 2021-09-30 NOTE — Discharge Instructions (Signed)
Your chest x-ray was normal.  You have been prescribed an albuterol inhaler to take as needed for shortness of breath.  Please go to the hospital if your shortness of breath worsens or does not improve.  It also appears that you have a urinary tract infection.  You have been prescribed an antibiotic to help treat this.  Urine culture is pending.  We will call if there are any abnormalities.

## 2021-09-30 NOTE — ED Triage Notes (Signed)
Pt reports that she had covid sxs starting on 12/3. Pt notes the majority of her sxs have resolved except for feeling tired and winded. Today, Pt returned to work and notes feeling sob completing small tasks. Pt is taking mucinex.  Yesterday, Pt reports an onset of dysuria and urinary frequency. She took remaining pills from last uti and AZO with relief.

## 2021-10-01 ENCOUNTER — Ambulatory Visit: Payer: Self-pay

## 2021-10-03 LAB — URINE CULTURE: Culture: 50000 — AB

## 2021-10-12 ENCOUNTER — Encounter: Payer: Self-pay | Admitting: Family Medicine

## 2021-10-12 ENCOUNTER — Ambulatory Visit: Payer: BC Managed Care – PPO | Admitting: Family Medicine

## 2021-10-12 ENCOUNTER — Other Ambulatory Visit: Payer: Self-pay

## 2021-10-12 VITALS — BP 124/80 | HR 88 | Ht 61.0 in | Wt 270.0 lb

## 2021-10-12 DIAGNOSIS — F419 Anxiety disorder, unspecified: Secondary | ICD-10-CM

## 2021-10-12 DIAGNOSIS — I10 Essential (primary) hypertension: Secondary | ICD-10-CM

## 2021-10-12 DIAGNOSIS — E7801 Familial hypercholesterolemia: Secondary | ICD-10-CM

## 2021-10-12 DIAGNOSIS — R7303 Prediabetes: Secondary | ICD-10-CM | POA: Diagnosis not present

## 2021-10-12 DIAGNOSIS — Z6841 Body Mass Index (BMI) 40.0 and over, adult: Secondary | ICD-10-CM

## 2021-10-12 MED ORDER — ALPRAZOLAM 0.25 MG PO TABS: 0.2500 mg | ORAL_TABLET | Freq: Every day | ORAL | 1 refills | Status: DC | PRN

## 2021-10-12 MED ORDER — LISINOPRIL-HYDROCHLOROTHIAZIDE 10-12.5 MG PO TABS: 1.0000 | ORAL_TABLET | Freq: Every day | ORAL | 1 refills | Status: DC

## 2021-10-12 NOTE — Progress Notes (Addendum)
Date:  10/12/2021   Name:  Krista Harrison   DOB:  1961/07/02   MRN:  [id number]   Chief Complaint: Hypertension and Anxiety  Hypertension This is a chronic problem. The current episode started more than 1 year ago. The problem has been gradually improving since onset. The problem is controlled. Associated symptoms include anxiety. Pertinent negatives include no blurred vision, chest pain, headaches, malaise/fatigue, neck pain, orthopnea, palpitations, peripheral edema, PND, shortness of breath or sweats. There are no associated agents to hypertension. Past treatments include ACE inhibitors and diuretics. The current treatment provides moderate improvement. There are no compliance problems.  There is no history of CAD/MI or CVA. There is no history of chronic renal disease, a hypertension causing med or renovascular disease.  Anxiety Presents for follow-up visit. Symptoms include panic. Patient reports no chest pain, compulsions, confusion, decreased concentration, depressed mood, dizziness, dry mouth, excessive worry, feeling of choking, hyperventilation, impotence, insomnia, irritability, malaise, muscle tension, nausea, nervous/anxious behavior, obsessions, palpitations, restlessness, shortness of breath or suicidal ideas. Symptoms occur occasionally.     Lab Results  Component Value Date   NA 136 05/25/2021   K 4.1 05/25/2021   CO2 25 05/25/2021   GLUCOSE 122 (H) 05/25/2021   BUN 18 05/25/2021   CREATININE 0.86 05/25/2021   CALCIUM 9.4 05/25/2021   GFRNONAA >60 05/25/2021   Lab Results  Component Value Date   CHOL 194 05/31/2020   HDL 58 05/31/2020   LDLCALC 120 (H) 05/31/2020   TRIG 89 05/31/2020   CHOLHDL 2.9 04/04/2018   Lab Results  Component Value Date   TSH 2.970 10/25/2020   Lab Results  Component Value Date   HGBA1C 6.4 (H) 10/25/2020   Lab Results  Component Value Date   WBC 10.1 05/25/2021   HGB 14.6 05/25/2021   HCT 43.6 05/25/2021   MCV 94.8  05/25/2021   PLT 291 05/25/2021   Lab Results  Component Value Date   ALT 18 10/25/2020   AST 13 10/25/2020   ALKPHOS 98 10/25/2020   BILITOT 0.2 10/25/2020   Lab Results  Component Value Date   VD25OH 27.0 (L) 10/25/2020     Review of Systems  Constitutional:  Positive for fatigue. Negative for irritability and malaise/fatigue.  Eyes:  Negative for blurred vision.  Respiratory:  Negative for apnea, shortness of breath and wheezing.   Cardiovascular:  Negative for chest pain, palpitations, orthopnea, leg swelling and PND.  Gastrointestinal:  Negative for nausea.  Genitourinary:  Negative for impotence.  Musculoskeletal:  Negative for neck pain.  Neurological:  Negative for dizziness and headaches.  Psychiatric/Behavioral:  Negative for confusion, decreased concentration and suicidal ideas. The patient is not nervous/anxious and does not have insomnia.    Patient Active Problem List   Diagnosis Date Noted   Morbid obesity ([place]) 10/28/2020   Prediabetes 10/26/2020   Vitamin D deficiency 10/26/2020   Pure hypercholesterolemia 04/04/2018   Rash, skin 04/04/2018   BRCA gene mutation negative 03/21/2017   Essential hypertension 09/25/2016   Gastroesophageal reflux disease 09/25/2016   Acute anxiety 09/25/2016    No Known Allergies  Past Surgical History:  Procedure Laterality Date   ABDOMINAL HYSTERECTOMY  2009   ROTATOR CUFF REPAIR Bilateral 2005, 2006    Social History   Tobacco Use   Smoking status: Never   Smokeless tobacco: Never  Vaping Use   Vaping Use: Never used  Substance Use Topics   Alcohol use: Yes    Alcohol/week: 0.0 standard  drinks    Comment: socially   Drug use: No     Medication list has been reviewed and updated.  Current Meds  Medication Sig   ALPRAZolam (XANAX) 0.25 MG tablet Take 1 tablet (0.25 mg total) by mouth daily as needed.   esomeprazole (NEXIUM) 20 MG capsule Take by mouth.   lisinopril-hydrochlorothiazide (ZESTORETIC)  10-12.5 MG tablet Take 1 tablet by mouth daily.   [DISCONTINUED] albuterol (VENTOLIN HFA) 108 (90 Base) MCG/ACT inhaler Inhale 1-2 puffs into the lungs every 6 (six) hours as needed for wheezing or shortness of breath.    PHQ 2/9 Scores 10/12/2021 12/17/2020 10/25/2020 05/31/2020  PHQ - 2 Score 0 0 3 0  PHQ- 9 Score 2 0 9 0    GAD 7 : Generalized Anxiety Score 10/12/2021 12/17/2020 05/31/2020 09/09/2019  Nervous, Anxious, on Edge 0 2 1 0  Control/stop worrying 0 1 1 0  Worry too much - different things 0 0 1 0  Trouble relaxing 0 0 0 0  Restless 0 0 0 0  Easily annoyed or irritable 0 0 0 0  Afraid - awful might happen 0 0 0 1  Total GAD 7 Score 0 '3 3 1  ' Anxiety Difficulty Not difficult at all Not difficult at all Not difficult at all -    BP Readings from Last 3 Encounters:  10/12/21 124/80  09/30/21 131/88  08/14/21 135/70    Physical Exam Vitals and nursing note reviewed.  Constitutional:      General: She is not in acute distress.    Appearance: She is not diaphoretic.  HENT:     Head: Normocephalic and atraumatic.     Right Ear: Tympanic membrane and external ear normal.     Left Ear: Tympanic membrane and external ear normal.     Nose: Nose normal.  Eyes:     General:        Right eye: No discharge.        Left eye: No discharge.     Conjunctiva/sclera: Conjunctivae normal.     Pupils: Pupils are equal, round, and reactive to light.  Neck:     Thyroid: No thyromegaly.     Vascular: No JVD.  Cardiovascular:     Rate and Rhythm: Normal rate and regular rhythm.     Heart sounds: Normal heart sounds, S1 normal and S2 normal. No murmur heard. No systolic murmur is present.  No diastolic murmur is present.    No friction rub. No gallop. No S3 or S4 sounds.  Pulmonary:     Effort: Pulmonary effort is normal.     Breath sounds: Normal breath sounds.  Abdominal:     General: Bowel sounds are normal.     Palpations: Abdomen is soft. There is no mass.     Tenderness:  There is no abdominal tenderness. There is no guarding.  Musculoskeletal:        General: Normal range of motion.     Cervical back: Normal range of motion and neck supple.  Lymphadenopathy:     Cervical: No cervical adenopathy.  Skin:    General: Skin is warm and dry.  Neurological:     Mental Status: She is alert.     Deep Tendon Reflexes: Reflexes are normal and symmetric.    Wt Readings from Last 3 Encounters:  10/12/21 270 lb (122.5 kg)  08/14/21 255 lb 1.2 oz (115.7 kg)  05/25/21 255 lb 1.2 oz (115.7 kg)    BP 124/80  Pulse 88    Ht '5\' 1"'  (1.549 m)    Wt 270 lb (122.5 kg)    BMI 51.02 kg/m   Assessment and Plan:  1. Essential hypertension Chronic.  Controlled.  Stable.  Blood pressure today is 124/80.  Continue lisinopril hydrochlorothiazide 10-12.5 mg once a day.  Will check renal function panel and will recheck patient in 6 months. - lisinopril-hydrochlorothiazide (ZESTORETIC) 10-12.5 MG tablet; Take 1 tablet by mouth daily.  Dispense: 90 tablet; Refill: 1 - Renal Function Panel  2. Acute anxiety Episodic.  Attributed to claustrophobia.  PHQ is 2.  Gad score is 0.  Continue alprazolam 0.25 on a as needed basis not to exceed 30 tablets over 6 months. - ALPRAZolam (XANAX) 0.25 MG tablet; Take 1 tablet (0.25 mg total) by mouth daily as needed.  Dispense: 30 tablet; Refill: 1  3. Familial hypercholesterolemia Chronic.  Controlled.  Stable.  This is currently diet controlled and we will recheck lipid panel to see if we need to proceed with medication. - Lipid Panel With LDL/HDL Ratio  4. Prediabetes New onset.  Last A1c 6.411 months ago.  Currently is controlling with diet.  Will check A1c for current reading. - Hemoglobin A1c  5. BMI 40.0-44.9, adult [facility]) Discussed with patient.Health risks of being over weight were discussed and patient was counseled on weight loss options and exercise.

## 2021-10-12 NOTE — Patient Instructions (Signed)

## 2021-10-13 ENCOUNTER — Encounter: Payer: Self-pay | Admitting: *Deleted

## 2021-10-13 LAB — RENAL FUNCTION PANEL
Albumin: 4.3 g/dL (ref 3.8–4.9)
BUN/Creatinine Ratio: 20 (ref 9–23)
BUN: 17 mg/dL (ref 6–24)
CO2: 25 mmol/L (ref 20–29)
Calcium: 9.3 mg/dL (ref 8.7–10.2)
Chloride: 102 mmol/L (ref 96–106)
Creatinine, Ser: 0.84 mg/dL (ref 0.57–1.00)
Glucose: 154 mg/dL — ABNORMAL HIGH (ref 70–99)
Phosphorus: 3.6 mg/dL (ref 3.0–4.3)
Potassium: 4.8 mmol/L (ref 3.5–5.2)
Sodium: 141 mmol/L (ref 134–144)
eGFR: 80 mL/min/{1.73_m2} (ref >59)

## 2021-10-13 LAB — HEMOGLOBIN A1C
Est. average glucose Bld gHb Est-mCnc: 140 mg/dL
Hgb A1c MFr Bld: 6.5 % — ABNORMAL HIGH (ref 4.8–5.6)

## 2021-10-13 LAB — LIPID PANEL WITH LDL/HDL RATIO
Cholesterol, Total: 244 mg/dL — ABNORMAL HIGH (ref 100–199)
HDL: 53 mg/dL (ref >39)
LDL Chol Calc (NIH): 158 mg/dL — ABNORMAL HIGH (ref 0–99)
LDL/HDL Ratio: 3 ratio (ref 0.0–3.2)
Triglycerides: 184 mg/dL — ABNORMAL HIGH (ref 0–149)
VLDL Cholesterol Cal: 33 mg/dL (ref 5–40)

## 2021-10-25 ENCOUNTER — Other Ambulatory Visit: Payer: BC Managed Care – PPO

## 2021-10-26 ENCOUNTER — Encounter: Payer: BC Managed Care – PPO | Admitting: Obstetrics and Gynecology

## 2021-12-01 ENCOUNTER — Encounter: Payer: BC Managed Care – PPO | Admitting: Obstetrics and Gynecology

## 2021-12-04 DIAGNOSIS — J209 Acute bronchitis, unspecified: Secondary | ICD-10-CM | POA: Diagnosis not present

## 2021-12-05 ENCOUNTER — Other Ambulatory Visit: Payer: BC Managed Care – PPO

## 2021-12-05 DIAGNOSIS — R7303 Prediabetes: Secondary | ICD-10-CM

## 2021-12-05 DIAGNOSIS — E7801 Familial hypercholesterolemia: Secondary | ICD-10-CM

## 2021-12-05 NOTE — Progress Notes (Signed)
Ordered A1C and lipid

## 2021-12-07 DIAGNOSIS — R0602 Shortness of breath: Secondary | ICD-10-CM | POA: Diagnosis not present

## 2021-12-07 DIAGNOSIS — R918 Other nonspecific abnormal finding of lung field: Secondary | ICD-10-CM | POA: Diagnosis not present

## 2021-12-07 DIAGNOSIS — R0600 Dyspnea, unspecified: Secondary | ICD-10-CM | POA: Diagnosis not present

## 2021-12-07 DIAGNOSIS — R059 Cough, unspecified: Secondary | ICD-10-CM | POA: Diagnosis not present

## 2021-12-15 ENCOUNTER — Other Ambulatory Visit: Payer: Self-pay

## 2021-12-15 ENCOUNTER — Ambulatory Visit
Admission: RE | Admit: 2021-12-15 | Discharge: 2021-12-15 | Disposition: A | Discharge status: Home / Self Care | Payer: BC Managed Care – PPO | Source: Ambulatory Visit | Attending: Internal Medicine | Admitting: Internal Medicine

## 2021-12-15 VITALS — BP 124/72 | HR 115 | Temp 97.6°F | Resp 16

## 2021-12-15 DIAGNOSIS — N3001 Acute cystitis with hematuria: Secondary | ICD-10-CM | POA: Insufficient documentation

## 2021-12-15 LAB — POCT URINALYSIS DIP (MANUAL ENTRY)
Bilirubin, UA: NEGATIVE — AB
Glucose, UA: NEGATIVE mg/dL
Ketones, POC UA: NEGATIVE mg/dL
Nitrite, UA: NEGATIVE
Protein Ur, POC: NEGATIVE mg/dL
Spec Grav, UA: 1.025 (ref 1.010–1.025)
Urobilinogen, UA: 0.2 E.U./dL
pH, UA: 6 (ref 5.0–8.0)

## 2021-12-15 MED ORDER — NITROFURANTOIN MONOHYD MACRO 100 MG PO CAPS: 100.0000 mg | ORAL_CAPSULE | Freq: Two times a day (BID) | ORAL | 0 refills | Status: DC

## 2021-12-15 NOTE — ED Triage Notes (Addendum)
Recent hx of pneumonia, finished antibiotics, has been taking breathing treatments. Still having some mild wheezing. ? ?HR 115/120 in triage, O2 94-96%, took ibuprofen and tylenol today. Also took azo 20 minutes ago. ? ?Primarily complaining of dysuria and urinary frequency starting today ?

## 2021-12-15 NOTE — Discharge Instructions (Addendum)
You have a urinary tract infection which is being treated with Macrobid antibiotic.  Urine culture is pending.  We will call if there are any abnormalities.  Please obtain a pulse oximeter and monitor your oxygen at home and follow-up if any shortness of breath returns or if your oxygen is 90 to 93%.  It is recommended that you go to the hospital of oxygen is this number. ?

## 2021-12-15 NOTE — ED Provider Notes (Signed)
?[place] ? ? ? ?CSN: [id number] ?Arrival date & time: 12/15/21  1455 ? ? ?  ? ?History   ?Chief Complaint ?Chief Complaint  ?Patient presents with  ? Dysuria  ? Wheezing  ? ? ?HPI ?JAMIESHA VICTORIA is a 61 y.o. female.  ? ?Patient presents for urinary burning and urinary frequency that started today.  Denies vaginal discharge, hematuria, back pain, pelvic pain, abdominal pain, fever. ? ?Patient also presents to have evaluation of her lung sounds as she has been diagnosed with bronchitis and pneumonia over the past 2 to 3 weeks.  She has taken amoxicillin and Levaquin as well as prednisone and albuterol inhaler and nebulizer treatments with improvement in symptoms.  She reports that she does not currently have any symptoms or shortness of breath but she wishes to have her lungs evaluated to make sure that they sound clear.  Denies any recent fevers. ? ? ?Dysuria ?Wheezing ? ?Past Medical History:  ?Diagnosis Date  ? Anxiety   ? Edema of both lower extremities   ? Factor V Leiden ([place])   ? heterozygous  ? GERD (gastroesophageal reflux disease)   ? Hypertension   ? ? ?Patient Active Problem List  ? Diagnosis Date Noted  ? Morbid obesity ([place]) 10/28/2020  ? Prediabetes 10/26/2020  ? Vitamin D deficiency 10/26/2020  ? Pure hypercholesterolemia 04/04/2018  ? Rash, skin 04/04/2018  ? BRCA gene mutation negative 03/21/2017  ? Essential hypertension 09/25/2016  ? Gastroesophageal reflux disease 09/25/2016  ? Acute anxiety 09/25/2016  ? ? ?Past Surgical History:  ?Procedure Laterality Date  ? ABDOMINAL HYSTERECTOMY  2009  ? ROTATOR CUFF REPAIR Bilateral 2005, 2006  ? ? ?OB History   ? ? Gravida  ?3  ? Para  ?1  ? Term  ?   ? Preterm  ?   ? AB  ?2  ? Living  ?   ?  ? ? SAB  ?2  ? IAB  ?   ? Ectopic  ?   ? Multiple  ?   ? Live Births  ?   ?   ?  ? Obstetric Comments  ?1st Menstrual Cycle:  13  ?1st Pregnancy:  28 ?  ?  ? ?  ? ? ? ?Home Medications   ? ?Prior to Admission medications   ?Medication Sig Start Date  End Date Taking? Authorizing Provider  ?nitrofurantoin, macrocrystal-monohydrate, (MACROBID) 100 MG capsule Take 1 capsule (100 mg total) by mouth 2 (two) times daily. 12/15/21  Yes Teodora Medici, FNP  ?ALPRAZolam (XANAX) 0.25 MG tablet Take 1 tablet (0.25 mg total) by mouth daily as needed. 10/12/21   Juline Patch, MD  ?esomeprazole (NEXIUM) 20 MG capsule Take by mouth.    [provider]  ?lisinopril-hydrochlorothiazide (ZESTORETIC) 10-12.5 MG tablet Take 1 tablet by mouth daily. 10/12/21   Juline Patch, MD  ?sertraline (ZOLOFT) 50 MG tablet Take 1 tablet (50 mg total) by mouth daily. 04/24/19 05/01/19  Juline Patch, MD  ? ? ?Family History ?Family History  ?Problem Relation Age of Onset  ? Cancer Mother   ?     ovarian  ? Breast cancer Mother 16  ? COPD Father   ? ? ?Social History ?Social History  ? ?Tobacco Use  ? Smoking status: Never  ? Smokeless tobacco: Never  ?Vaping Use  ? Vaping Use: Never used  ?Substance Use Topics  ? Alcohol use: Yes  ?  Alcohol/week: 0.0 standard drinks  ?  Comment: socially  ? Drug use: No  ? ? ? ?Allergies   ?Patient has no known allergies. ? ? ?Review of Systems ?Review of Systems ?Per HPI ? ?Physical Exam ?Triage Vital Signs ?ED Triage Vitals  ?Enc Vitals Group  ?   BP 12/15/21 1510 124/72  ?   Pulse Rate 12/15/21 1510 (!) 115  ?   Resp 12/15/21 1510 16  ?   Temp 12/15/21 1510 97.6 ?F (36.4 ?C)  ?   Temp Source 12/15/21 1510 Oral  ?   SpO2 12/15/21 1510 95 %  ?   Weight --   ?   Height --   ?   Head Circumference --   ?   Peak Flow --   ?   Pain Score 12/15/21 1511 0  ?   Pain Loc --   ?   Pain Edu? --   ?   Excl. in [place]? --   ? ?No data found. ? ?Updated Vital Signs ?BP 124/72 (BP Location: Left Arm)   Pulse (!) 115   Temp 97.6 ?F (36.4 ?C) (Oral)   Resp 16   SpO2 95%  ? ?Visual Acuity ?Right Eye Distance:   ?Left Eye Distance:   ?Bilateral Distance:   ? ?Right Eye Near:   ?Left Eye Near:    ?Bilateral Near:    ? ?Physical Exam ?Constitutional:   ?   General:  She is not in acute distress. ?   Appearance: Normal appearance. She is not toxic-appearing or diaphoretic.  ?HENT:  ?   Head: Normocephalic and atraumatic.  ?Eyes:  ?   Extraocular Movements: Extraocular movements intact.  ?   Conjunctiva/sclera: Conjunctivae normal.  ?Cardiovascular:  ?   Rate and Rhythm: Normal rate and regular rhythm.  ?   Pulses: Normal pulses.  ?   Heart sounds: Normal heart sounds.  ?Pulmonary:  ?   Effort: Pulmonary effort is normal. No respiratory distress.  ?   Breath sounds: Normal breath sounds. No stridor. No wheezing, rhonchi or rales.  ?Abdominal:  ?   General: Bowel sounds are normal. There is no distension.  ?   Palpations: Abdomen is soft.  ?   Tenderness: There is no abdominal tenderness.  ?Neurological:  ?   General: No focal deficit present.  ?   Mental Status: She is alert and oriented to person, place, and time. Mental status is at baseline.  ?Psychiatric:     ?   Mood and Affect: Mood normal.     ?   Behavior: Behavior normal.     ?   Thought Content: Thought content normal.     ?   Judgment: Judgment normal.  ? ? ? ?UC Treatments / Results  ?Labs ?(all labs ordered are listed, but only abnormal results are displayed) ?Labs Reviewed  ?POCT URINALYSIS DIP (MANUAL ENTRY) - Abnormal; Notable for the following components:  ?    Result Value  ? Bilirubin, UA negative (*)   ? Blood, UA trace-intact (*)   ? Leukocytes, UA Large (3+) (*)   ? All other components within normal limits  ?URINE CULTURE  ? ? ?EKG ? ? ?Radiology ?No results found. ? ?Procedures ?Procedures (including critical care time) ? ?Medications Ordered in UC ?Medications - No data to display ? ?Initial Impression / Assessment and Plan / UC Course  ?I have reviewed the triage vital signs and the nursing notes. ? ?Pertinent labs & imaging results that were available during my care of the  patient were reviewed by me and considered in my medical decision making (see chart for details). ? ?  ? ?Urinalysis indicating  urinary tract infection.  Will treat with Macrobid antibiotic.  Urine culture pending. ? ?Lung sounds are completely normal and there is no signs of respiratory distress or compromise on exam.  Oxygen saturation is 94 to 96%.  Patient advised to monitor oxygen at home with pulse oximeter but there is no concern at this time.  Strict return and ER precautions given.  Patient to follow-up with PCP for repeat x-ray in the next few weeks to make sure that pneumonia has resolved.  Patient voiced understanding and was agreeable with plan. ?Final Clinical Impressions(s) / UC Diagnoses  ? ?Final diagnoses:  ?Acute cystitis with hematuria  ? ? ? ?Discharge Instructions   ? ?  ?You have a urinary tract infection which is being treated with Macrobid antibiotic.  Urine culture is pending.  We will call if there are any abnormalities.  Please obtain a pulse oximeter and monitor your oxygen at home and follow-up if any shortness of breath returns or if your oxygen is 90 to 93%.  It is recommended that you go to the hospital of oxygen is this number. ? ? ? ?ED Prescriptions   ? ? Medication Sig Dispense Auth. Provider  ? nitrofurantoin, macrocrystal-monohydrate, (MACROBID) 100 MG capsule Take 1 capsule (100 mg total) by mouth 2 (two) times daily. 10 capsule Oswaldo Conroy E, [place]  ? ?  ? ?PDMP not reviewed this encounter. ?  ?Teodora Medici, [place] ?12/15/21 1551 ? ?

## 2021-12-17 LAB — URINE CULTURE: Culture: 20000 — AB

## 2022-01-10 ENCOUNTER — Ambulatory Visit: Payer: BC Managed Care – PPO | Admitting: Obstetrics and Gynecology

## 2022-01-10 ENCOUNTER — Other Ambulatory Visit: Payer: Self-pay

## 2022-01-10 ENCOUNTER — Encounter: Payer: Self-pay | Admitting: Obstetrics and Gynecology

## 2022-01-10 ENCOUNTER — Other Ambulatory Visit (HOSPITAL_COMMUNITY)
Admission: RE | Admit: 2022-01-10 | Discharge: 2022-01-10 | Disposition: A | Discharge status: Home / Self Care | Payer: BC Managed Care – PPO | Source: Ambulatory Visit | Attending: Obstetrics and Gynecology | Admitting: Obstetrics and Gynecology

## 2022-01-10 VITALS — BP 139/95 | HR 105 | Ht 62.0 in | Wt 269.5 lb

## 2022-01-10 DIAGNOSIS — R350 Frequency of micturition: Secondary | ICD-10-CM

## 2022-01-10 DIAGNOSIS — R102 Pelvic and perineal pain: Secondary | ICD-10-CM

## 2022-01-10 DIAGNOSIS — Z01419 Encounter for gynecological examination (general) (routine) without abnormal findings: Secondary | ICD-10-CM | POA: Diagnosis not present

## 2022-01-10 DIAGNOSIS — Z1231 Encounter for screening mammogram for malignant neoplasm of breast: Secondary | ICD-10-CM | POA: Diagnosis not present

## 2022-01-10 DIAGNOSIS — Z124 Encounter for screening for malignant neoplasm of cervix: Secondary | ICD-10-CM | POA: Diagnosis not present

## 2022-01-10 DIAGNOSIS — N898 Other specified noninflammatory disorders of vagina: Secondary | ICD-10-CM

## 2022-01-10 DIAGNOSIS — Z7689 Persons encountering health services in other specified circumstances: Secondary | ICD-10-CM

## 2022-01-10 LAB — POCT URINALYSIS DIPSTICK
Bilirubin, UA: NEGATIVE
Blood, UA: NEGATIVE
Glucose, UA: NEGATIVE
Ketones, UA: NEGATIVE
Leukocytes, UA: NEGATIVE
Nitrite, UA: NEGATIVE
Protein, UA: NEGATIVE
Spec Grav, UA: 1.015 (ref 1.010–1.025)
Urobilinogen, UA: 0.2 E.U./dL
pH, UA: 6.5 (ref 5.0–8.0)

## 2022-01-10 NOTE — Progress Notes (Signed)
Patient presents today for annual exam. She reports recent UTI symptoms of urinary frequency, pelvic pressure and slight odor. Mammogram ordered. Patient states no other questions or concerns today. ?

## 2022-01-10 NOTE — Progress Notes (Signed)
HPI: ?     [name] is a 61 y.o. [id number] who LMP was No LMP recorded. Patient has had a hysterectomy. ? ?Subjective:  ? ?She presents today for her annual examination.  She also has multiple complaints.  She states that for the past 7 to 8 months she has had a pelvic pressure feeling which has been intermittent.  "Sometimes it feels like a bowling ball in my pelvis".  She also complains of a vaginal discharge with odor similar to when she had BV.  For the past 3 days she is taking "leftover Flagyl". ?She previously had a supracervical hysterectomy for uterine fibroids.  She is unsure of why she had a supracervical hysterectomy. ?She has a family history of ovarian cancer-her mother died from this-[name] has been BRCA tested and is negative. ? ?  Hx: ?The following portions of the patient's history were reviewed and updated as appropriate: ?            She  has a past medical history of Anxiety, Edema of both lower extremities, Factor V Leiden ([place]), GERD (gastroesophageal reflux disease), and Hypertension. ?She does not have any pertinent problems on file. ?She  has a past surgical history that includes Abdominal hysterectomy (2009) and Rotator cuff repair (Bilateral, 2005, 2006). ?Her family history includes Breast cancer (age of onset: 6) in her mother; COPD in her father; Cancer in her mother. ?She  reports that she has never smoked. She has never used smokeless tobacco. She reports current alcohol use. She reports that she does not use drugs. ?She has a current medication list which includes the following prescription(s): alprazolam, esomeprazole, lisinopril-hydrochlorothiazide, nitrofurantoin (macrocrystal-monohydrate), and [DISCONTINUED] sertraline. ?She has No Known Allergies. ?      ?Review of Systems:  ?Review of Systems ? ?Constitutional: Denied constitutional symptoms, night sweats, recent illness, fatigue, fever, insomnia and weight loss.  ?Eyes: Denied eye symptoms, eye pain,  photophobia, vision change and visual disturbance.  ?Ears/Nose/Throat/Neck: Denied ear, nose, throat or neck symptoms, hearing loss, nasal discharge, sinus congestion and sore throat.  ?Cardiovascular: Denied cardiovascular symptoms, arrhythmia, chest pain/pressure, edema, exercise intolerance, orthopnea and palpitations.  ?Respiratory: Denied pulmonary symptoms, asthma, pleuritic pain, productive sputum, cough, dyspnea and wheezing.  ?Gastrointestinal: Denied, gastro-esophageal reflux, melena, nausea and vomiting.  ?Genitourinary: See HPI for additional information.  ?Musculoskeletal: Denied musculoskeletal symptoms, stiffness, swelling, muscle weakness and myalgia.  ?Dermatologic: Denied dermatology symptoms, rash and scar.  ?Neurologic: Denied neurology symptoms, dizziness, headache, neck pain and syncope.  ?Psychiatric: Denied psychiatric symptoms, anxiety and depression.  ?Endocrine: Denied endocrine symptoms including hot flashes and night sweats.  ? ?Meds: ?  ?Current Outpatient Medications on File Prior to Visit  ?Medication Sig Dispense Refill  ? ALPRAZolam (XANAX) 0.25 MG tablet Take 1 tablet (0.25 mg total) by mouth daily as needed. 30 tablet 1  ? esomeprazole (NEXIUM) 20 MG capsule Take by mouth.    ? lisinopril-hydrochlorothiazide (ZESTORETIC) 10-12.5 MG tablet Take 1 tablet by mouth daily. 90 tablet 1  ? nitrofurantoin, macrocrystal-monohydrate, (MACROBID) 100 MG capsule Take 1 capsule (100 mg total) by mouth 2 (two) times daily. 10 capsule 0  ? [DISCONTINUED] sertraline (ZOLOFT) 50 MG tablet Take 1 tablet (50 mg total) by mouth daily. 90 tablet 0  ? ?No current facility-administered medications on file prior to visit.  ? ? ? ?Objective:  ?  ? ?Vitals:  ? 01/10/22 1405  ?BP: (!) 139/95  ?Pulse: (!) 105  ?  ?Filed Weights  ? 01/10/22  1405  ?Weight: 269 lb 8 oz (122.2 kg)  ? ?  ?         Physical examination ?General NAD, Conversant  ?HEENT Atraumatic; Op clear with mmm.  Normo-cephalic. Pupils reactive.  Anicteric sclerae  ?Thyroid/Neck Smooth without nodularity or enlargement. Normal ROM.  Neck Supple.  ?Skin No rashes, lesions or ulceration. Normal palpated skin turgor. No nodularity.  ?Breasts: No masses or discharge.  Symmetric.  No axillary adenopathy.  ?Lungs: Clear to auscultation.No rales or wheezes. Normal Respiratory effort, no retractions.  ?Heart: NSR.  No murmurs or rubs appreciated. No periferal edema  ?Abdomen: Soft.  Non-tender.  No masses.  No HSM. No hernia  ?Extremities: Moves all appropriately.  Normal ROM for age. No lymphadenopathy.  ?Neuro: Oriented to PPT.  Normal mood. Normal affect.  ? ?  Pelvic:   ?Vulva: Normal appearance.  No lesions.  ?Vagina: No lesions or abnormalities noted.  ?Support: Normal pelvic support.  ?Urethra No masses tenderness or scarring.  ?Meatus Normal size without lesions or prolapse.  ?Cervix: Normal appearance.  No lesions.  ?Anus: Normal exam.  No lesions.  ?Perineum: Normal exam.  No lesions.  ?      Bimanual   ?Uterus: Surgically absent  ?Adnexae: No masses.  Non-tender to palpation.  ?Cul-de-sac: Negative for abnormality.  ? ? ? ?Assessment:  ?  ?[id number] ?Patient Active Problem List  ? Diagnosis Date Noted  ? Morbid obesity ([place]) 10/28/2020  ? Prediabetes 10/26/2020  ? Vitamin D deficiency 10/26/2020  ? Pure hypercholesterolemia 04/04/2018  ? Rash, skin 04/04/2018  ? BRCA gene mutation negative 03/21/2017  ? Essential hypertension 09/25/2016  ? Gastroesophageal reflux disease 09/25/2016  ? Acute anxiety 09/25/2016  ? ?  ?1. Well woman exam with routine gynecological exam   ?2. Establishing care with new doctor, encounter for   ?3. Screening mammogram for breast cancer   ?4. Cervical cancer screening   ?5. Urinary frequency   ?6. Pelvic pressure in female   ?7. Vaginal odor   ? ?  ? ? ?Plan:  ?  ?       ? 1.  Basic Screening Recommendations ?The basic screening recommendations for asymptomatic women were discussed with the patient during her visit.  The  age-appropriate recommendations were discussed with her and the rational for the tests reviewed.  When I am informed by the patient that another primary care physician has previously obtained the age-appropriate tests and they are up-to-date, only outstanding tests are ordered and referrals given as necessary.  Abnormal results of tests will be discussed with her when all of her results are completed.  Routine preventative health maintenance measures emphasized: Exercise/Diet/Weight control, Tobacco Warnings, Alcohol/Substance use risks and Stress Management ?Pap smear performed -mammogram ordered -blood work ordered (patient will return when fasting) ?2.  Urine sent for culture ?3.  Patient to return in 1 week if she continues to have vaginal symptoms.  At her return she can perform a [place] drop off. ?4.  She has requested a pelvic ultrasound because of her family history and her intermittent pelvic pressure symptoms. ? ?Orders ?Orders Placed This Encounter  ?Procedures  ? Urine Culture  ? MM DIGITAL SCREENING BILATERAL  ? US PELVIS TRANSVAGINAL NON-OB (TV ONLY)  ? US PELVIS (TRANSABDOMINAL ONLY)  ? Basic metabolic panel  ? CBC  ? TSH  ? Lipid panel  ? Hemoglobin A1c  ? POCT urinalysis dipstick  ? ? No orders of the defined types were placed in this  encounter. ?    ?  ?  F/U ? Return in about 1 year (around 01/11/2023) for Annual Physical. ? ?Finis Bud, M.D. ?01/10/2022 ?3:03 PM ? ? ? ?

## 2022-01-12 LAB — URINE CULTURE

## 2022-01-12 LAB — CYTOLOGY - PAP
Comment: NEGATIVE
Diagnosis: NEGATIVE
High risk HPV: NEGATIVE

## 2022-01-16 ENCOUNTER — Other Ambulatory Visit: Payer: BC Managed Care – PPO

## 2022-01-16 ENCOUNTER — Ambulatory Visit (INDEPENDENT_AMBULATORY_CARE_PROVIDER_SITE_OTHER): Payer: BC Managed Care – PPO

## 2022-01-16 ENCOUNTER — Other Ambulatory Visit: Payer: Self-pay | Admitting: Obstetrics and Gynecology

## 2022-01-16 DIAGNOSIS — R102 Pelvic and perineal pain: Secondary | ICD-10-CM | POA: Diagnosis not present

## 2022-01-16 DIAGNOSIS — Z01419 Encounter for gynecological examination (general) (routine) without abnormal findings: Secondary | ICD-10-CM | POA: Diagnosis not present

## 2022-01-17 LAB — CBC
Hematocrit: 44.4 % (ref 34.0–46.6)
Hemoglobin: 14.3 g/dL (ref 11.1–15.9)
MCH: 30.9 pg (ref 26.6–33.0)
MCHC: 32.2 g/dL (ref 31.5–35.7)
MCV: 96 fL (ref 79–97)
Platelets: 312 10*3/uL (ref 150–450)
RBC: 4.63 x10E6/uL (ref 3.77–5.28)
RDW: 12.6 % (ref 11.7–15.4)
WBC: 10.1 10*3/uL (ref 3.4–10.8)

## 2022-01-17 LAB — BASIC METABOLIC PANEL
BUN/Creatinine Ratio: 21 (ref 12–28)
BUN: 19 mg/dL (ref 8–27)
CO2: 24 mmol/L (ref 20–29)
Calcium: 9.6 mg/dL (ref 8.7–10.3)
Chloride: 100 mmol/L (ref 96–106)
Creatinine, Ser: 0.89 mg/dL (ref 0.57–1.00)
Glucose: 139 mg/dL — ABNORMAL HIGH (ref 70–99)
Potassium: 5.2 mmol/L (ref 3.5–5.2)
Sodium: 140 mmol/L (ref 134–144)
eGFR: 74 mL/min/{1.73_m2} (ref >59)

## 2022-01-17 LAB — LIPID PANEL
Chol/HDL Ratio: 4.1 ratio (ref 0.0–4.4)
Cholesterol, Total: 219 mg/dL — ABNORMAL HIGH (ref 100–199)
HDL: 54 mg/dL (ref >39)
LDL Chol Calc (NIH): 131 mg/dL — ABNORMAL HIGH (ref 0–99)
Triglycerides: 190 mg/dL — ABNORMAL HIGH (ref 0–149)
VLDL Cholesterol Cal: 34 mg/dL (ref 5–40)

## 2022-01-17 LAB — HEMOGLOBIN A1C
Est. average glucose Bld gHb Est-mCnc: 137 mg/dL
Hgb A1c MFr Bld: 6.4 % — ABNORMAL HIGH (ref 4.8–5.6)

## 2022-01-17 LAB — TSH: TSH: 4.48 u[IU]/mL (ref 0.450–4.500)

## 2022-01-18 ENCOUNTER — Encounter: Payer: BC Managed Care – PPO | Admitting: Obstetrics and Gynecology

## 2022-01-18 NOTE — Progress Notes (Signed)
Forwarded to patient

## 2022-01-18 NOTE — Progress Notes (Signed)
Krista Harrison: ?Your lab work shows an elevated cholesterol and an elevated A1c.  The elevated cholesterol can sometimes be treated with diet or with a medication called a statin.  The A1c likely represents prediabetes.  I think it would be advisable to meet with a family doctor or internist to discuss these issues further.

## 2022-01-23 NOTE — Progress Notes (Signed)
Butch Penny: Kermit Balo news!  Your pelvic ultrasound is negative. ?Of course, this does not explain the pelvic pressure feelings you are having.

## 2022-02-08 IMAGING — MG DIGITAL SCREENING BILAT W/ TOMO W/ CAD
8 series · 8 of 24 positions shown · non-contrast
Comparison: Previous exam(s).

ACR Breast Density Category a: The breast tissue is almost entirely
fatty.

CLINICAL DATA: Screening.

EXAM:
DIGITAL SCREENING BILATERAL MAMMOGRAM WITH TOMO AND CAD

[R CC synth-2D]
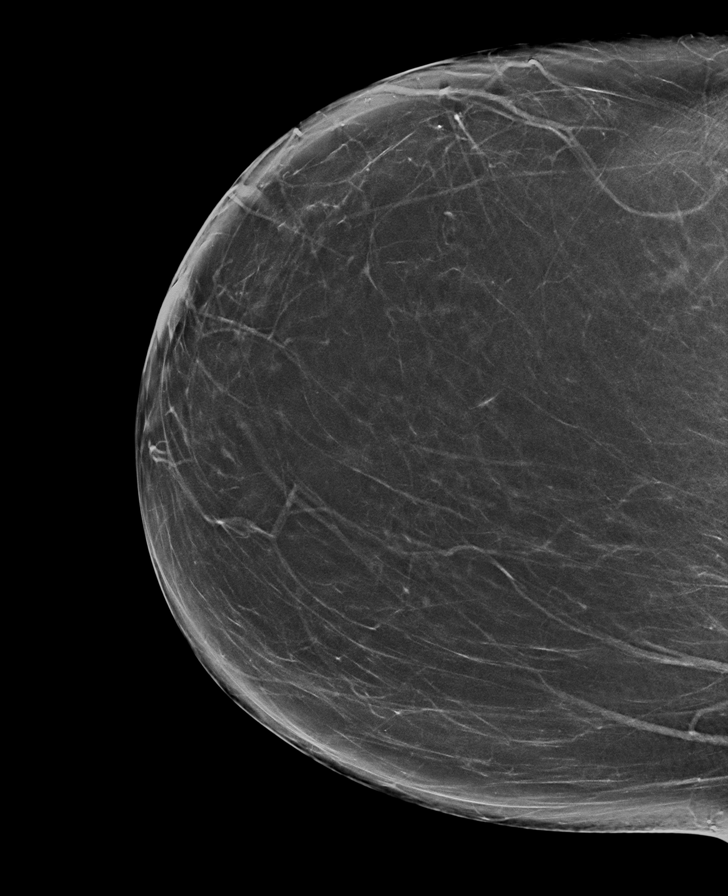

[L CC synth-2D]
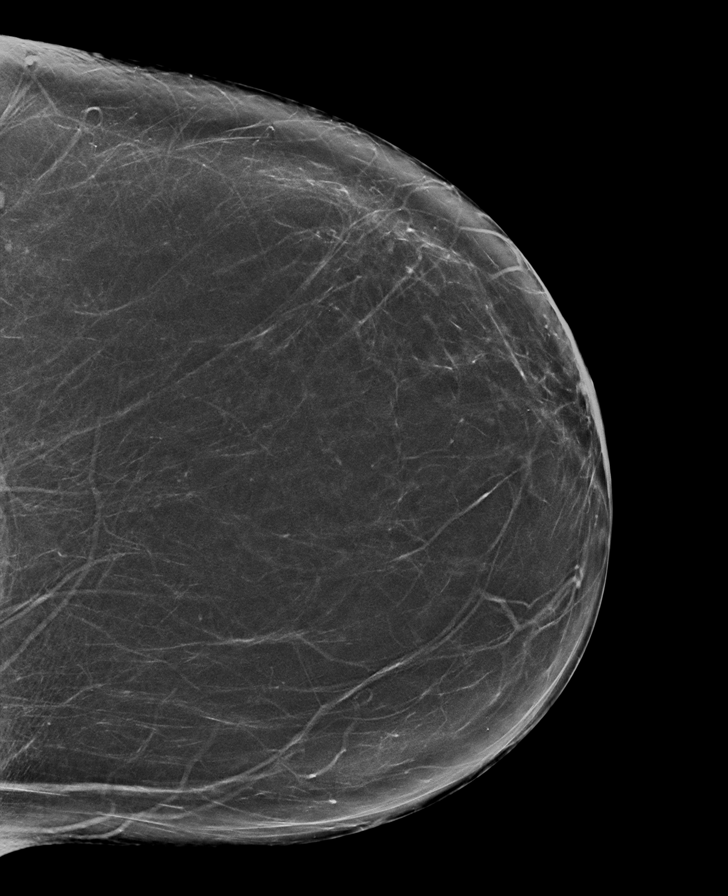

[R MLO synth-2D]
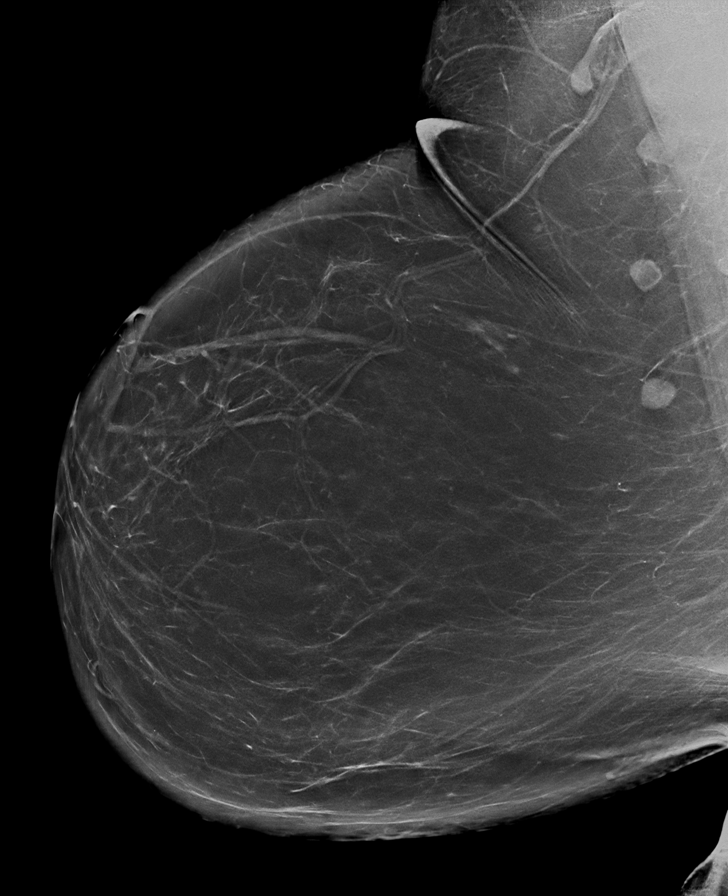

[L MLO synth-2D]
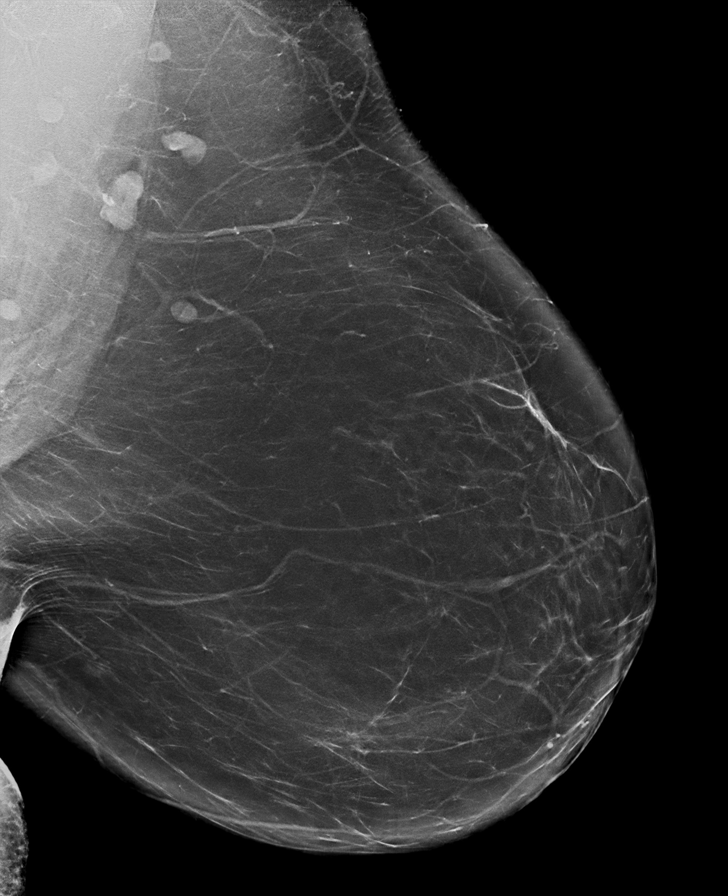

[R CC tomo · tomo slice 40/79.0]
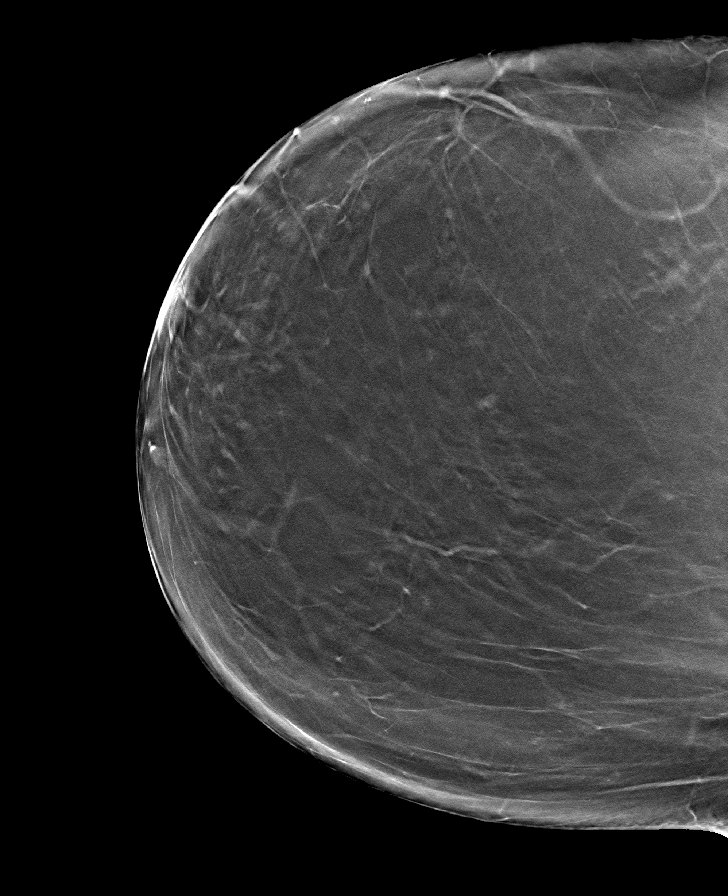

[L CC tomo · tomo slice 39/77.0]
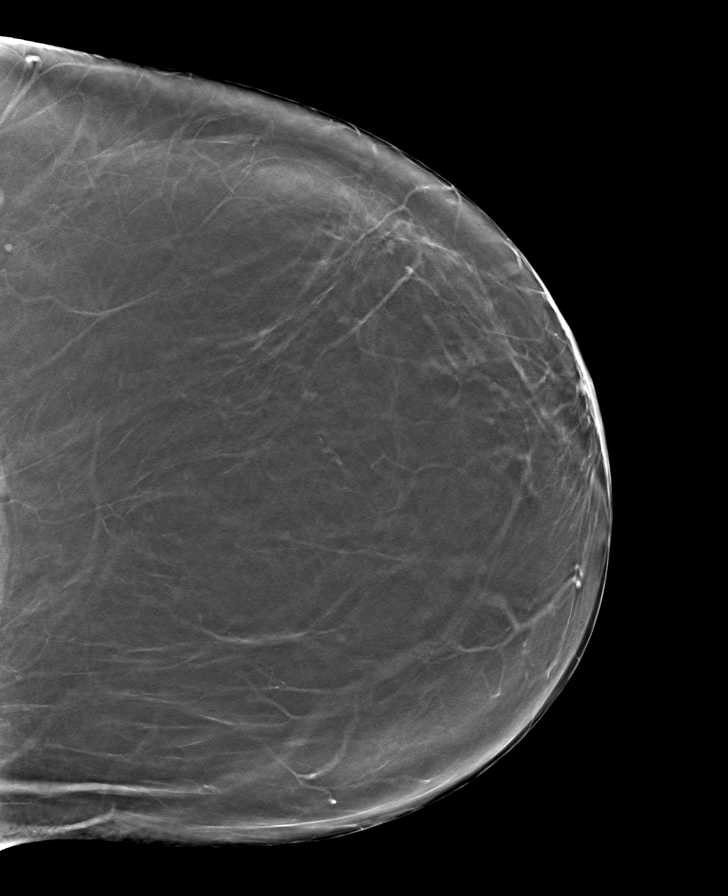

[L MLO tomo · tomo slice 49/96.0]
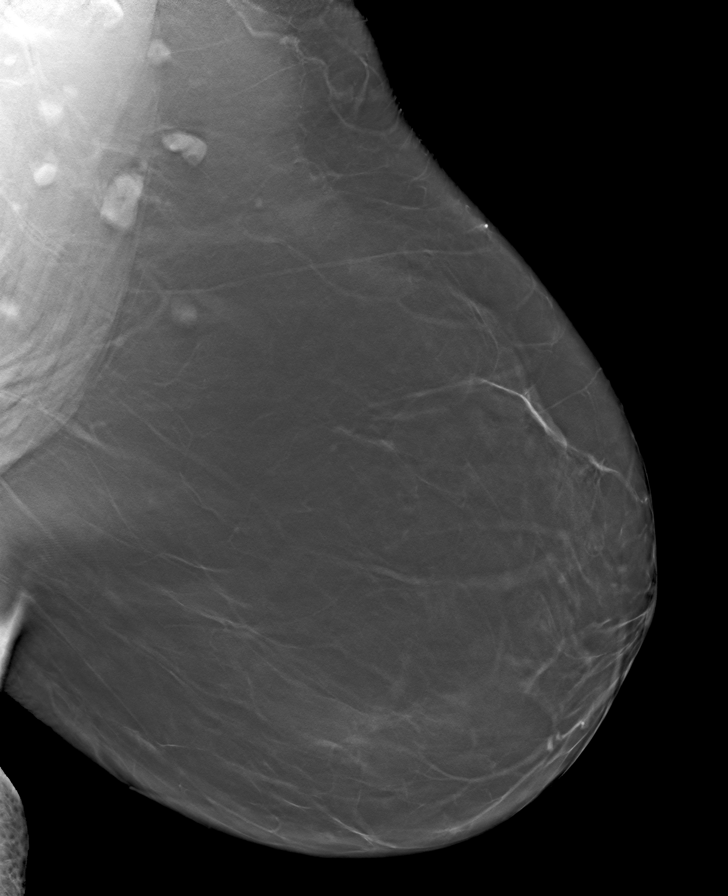

[R MLO tomo · tomo slice 49/97.0]
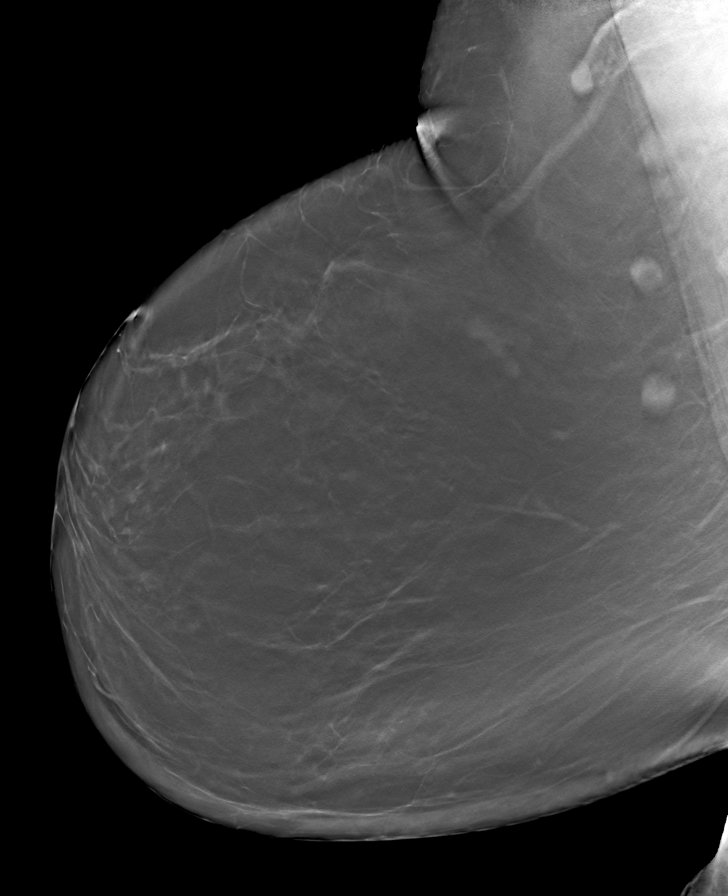

[8 of 24 positions shown; findings below may reference images not displayed]

FINDINGS: There are no findings suspicious for malignancy. Images were
processed with CAD.
IMPRESSION: No mammographic evidence of malignancy. A result letter of this
screening mammogram will be mailed directly to the patient.

RECOMMENDATION:
Screening mammogram in one year. (Code:[id number]-[id number])

BI-RADS CATEGORY  1: Negative.

## 2022-03-31 ENCOUNTER — Encounter: Payer: Self-pay | Admitting: Plastic Surgery

## 2022-03-31 ENCOUNTER — Ambulatory Visit: Payer: BC Managed Care – PPO | Admitting: Plastic Surgery

## 2022-03-31 ENCOUNTER — Other Ambulatory Visit: Payer: Self-pay

## 2022-03-31 VITALS — BP 125/84 | HR 82 | Ht 62.0 in | Wt 262.0 lb

## 2022-03-31 DIAGNOSIS — M546 Pain in thoracic spine: Secondary | ICD-10-CM

## 2022-03-31 DIAGNOSIS — N62 Hypertrophy of breast: Secondary | ICD-10-CM

## 2022-03-31 DIAGNOSIS — M545 Low back pain, unspecified: Secondary | ICD-10-CM

## 2022-03-31 DIAGNOSIS — Z803 Family history of malignant neoplasm of breast: Secondary | ICD-10-CM

## 2022-03-31 DIAGNOSIS — M542 Cervicalgia: Secondary | ICD-10-CM | POA: Diagnosis not present

## 2022-03-31 DIAGNOSIS — R21 Rash and other nonspecific skin eruption: Secondary | ICD-10-CM

## 2022-03-31 DIAGNOSIS — Z6841 Body Mass Index (BMI) 40.0 and over, adult: Secondary | ICD-10-CM

## 2022-04-01 NOTE — Progress Notes (Signed)
Referring Provider Juline Patch, MD [address] [place],  [place] [address]   CC:  Breast hypertrophy and back pain   Krista Harrison is an 61 y.o. female.  HPI:   The patient is a 61 y.o. female with a history of mammary hyperplasia for several years.  She has extremely large breasts causing symptoms that include the following: Back pain in the upper and lower back, including neck pain. She pulls or pins her bra straps to provide better lift and relief of the pressure and pain. She notices relief by holding her breast up manually.  Her shoulder straps cause grooves and pain and pressure that requires padding for relief. Pain medication is sometimes required with motrin and tylenol.  Activities that are hindered by enlarged breasts include: exercise and running.  She has tried supportive clothing as well as fitted bras without improvement.     Mammogram history: ordered.  Family history of breast cancer:  mother.  Tobacco use:  none.   The patient expresses the desire to pursue surgical intervention.  The BMI = 48.  Preoperative bra size = G cup.   No Known Allergies  Outpatient Encounter Medications as of 03/31/2022  Medication Sig   ALPRAZolam (XANAX) 0.25 MG tablet Take 1 tablet (0.25 mg total) by mouth daily as needed.   esomeprazole (NEXIUM) 20 MG capsule Take by mouth.   lisinopril-hydrochlorothiazide (ZESTORETIC) 10-12.5 MG tablet Take 1 tablet by mouth daily.   nitrofurantoin, macrocrystal-monohydrate, (MACROBID) 100 MG capsule Take 1 capsule (100 mg total) by mouth 2 (two) times daily.   [DISCONTINUED] sertraline (ZOLOFT) 50 MG tablet Take 1 tablet (50 mg total) by mouth daily.   No facility-administered encounter medications on file as of 03/31/2022.     Past Medical History:  Diagnosis Date   Anxiety    Edema of both lower extremities    Factor V Leiden ([place])    heterozygous   GERD (gastroesophageal reflux disease)    Hypertension     Past Surgical  History:  Procedure Laterality Date   ABDOMINAL HYSTERECTOMY  2009   ROTATOR CUFF REPAIR Bilateral 2005, 2006    Family History  Problem Relation Age of Onset   Cancer Mother        ovarian   Breast cancer Mother 29   COPD Father     Social History   Social History Narrative   Not on file     Review of Systems General: Denies fevers, chills, weight loss CV: Denies chest pain, shortness of breath, palpitations   Physical Exam    03/31/2022    9:37 AM 01/10/2022    2:05 PM 12/15/2021    3:10 PM  Vitals with BMI  Height '5\' 2"'$  '5\' 2"'$    Weight 262 lbs 269 lbs 8 oz   BMI 86.76 72.09   Systolic 470 962 836  Diastolic 84 95 72  Pulse 82 105 115    General:  No acute distress,  Alert and oriented, Non-Toxic, Normal speech and affect Breast: No easily palpable breast masses on physical exam, significant breast ptosis and macromastia. Her breasts are extremely large with the right significantly larger.  She has hyperpigmentation of the inframammary area on both sides.  The sternal to nipple distance on the right is 35 cm and the left is 35 cm.  The IMF distance is 21 cm on the right and 17 cm on the left.  Base width is 21 bilaterally. Assessment/Plan   The  patient has bilateral symptomatic macromastia.  She is a good candidate for a breast reduction.  She is going to complete PT and a medical weight loss program.  Her goal is to lose 50-60 lbs.  She is interested in pursuing surgical treatment.  She has tried supportive garments and fitted bras with no relief.  The details of breast reduction surgery were discussed.  I explained the procedure in detail along the with the expected scars.  The risks were discussed in detail and include bleeding, infection, damage to surrounding structures, need for additional procedures, nipple loss, change in nipple sensation, persistent pain, contour irregularities and asymmetries.  I explained that breast feeding is often not possible after breast  reduction surgery.  We discussed the expected postoperative course with an overall recovery period of about 1 month.  She demonstrated full understanding of all risks.  We discussed her personal risk factors that include high bmi.  The patient is interested in pursuing surgical treatment.  The estimated excess breast tissue to be removed at the time of surgery is  >1000 grams on the left and >1000 grams on the right. Lennice Sites 04/01/2022, 11:34 AM

## 2022-04-12 ENCOUNTER — Ambulatory Visit: Payer: BC Managed Care – PPO | Admitting: Family Medicine

## 2022-04-12 DIAGNOSIS — Z0289 Encounter for other administrative examinations: Secondary | ICD-10-CM

## 2022-04-20 NOTE — Therapy (Signed)
OUTPATIENT PHYSICAL THERAPY EVALUATION   Patient Name: Krista Harrison MRN: [id number] DOB:[birth year], 61 y.o., female Today's Date: 04/21/2022   PT End of Session - 04/21/22 0838     Visit Number 1    Number of Visits 7    Date for PT Re-Evaluation 06/03/22    Authorization Type BCBS    PT Start Time 0845    PT Stop Time 0918    PT Time Calculation (min) 33 min    Activity Tolerance Patient tolerated treatment well    Behavior During Therapy WFL for tasks assessed/performed             Past Medical History:  Diagnosis Date   Anxiety    Edema of both lower extremities    Factor V Leiden ([place])    heterozygous   GERD (gastroesophageal reflux disease)    Hypertension    Past Surgical History:  Procedure Laterality Date   ABDOMINAL HYSTERECTOMY  2009   ROTATOR CUFF REPAIR Bilateral 2005, 2006   Patient Active Problem List   Diagnosis Date Noted   Morbid obesity ([place]) 10/28/2020   Prediabetes 10/26/2020   Vitamin D deficiency 10/26/2020   Pure hypercholesterolemia 04/04/2018   Rash, skin 04/04/2018   BRCA gene mutation negative 03/21/2017   Essential hypertension 09/25/2016   Gastroesophageal reflux disease 09/25/2016   Acute anxiety 09/25/2016    PCP: Juline Patch, MD  REFERRING PROVIDER: Lennice Sites, MD   REFERRING DIAG: eval and treat for neck, back and shoulder pain due to mammary hypertrophy   THERAPY DIAG:  Cervicalgia  Other low back pain  Muscle weakness (generalized)  Abnormal posture  Rationale for Evaluation and Treatment Rehabilitation  ONSET DATE: chronic   SUBJECTIVE:                                                                                                                                                                                                         SUBJECTIVE STATEMENT: Patient reports her ultimate goal is to receive a breast reduction. She reports tension in her neck/shoulders/back that she attributes to her  breast size that has been ongoing for years. She has recently started personal training to help with weight loss.   PERTINENT HISTORY:  History of bilateral Rotator cuff repair 2004 and 2005.   PAIN:  Are you having pain? No Pain at worst 8/10; tension throughout neck and back that is worse at the end of the day   PRECAUTIONS: None  WEIGHT BEARING RESTRICTIONS No  FALLS:  Has patient fallen in last 6 months?  No  LIVING ENVIRONMENT: Lives with: lives with their spouse Lives in: House/apartment Stairs: Yes; stairs to enter 3  Has following equipment at home: None  OCCUPATION: [employer] cabin crew   PLOF: Independent  PATIENT GOALS "I need to get my body strong to accommodate a surgery."  OBJECTIVE:   DIAGNOSTIC FINDINGS:  N/A  PATIENT SURVEYS:  N/A   COGNITION: Overall cognitive status: Within functional limits for tasks assessed   SENSATION: Not tested  POSTURE: rounded shoulders and forward head; slump sitting posture   PALPATION: TTP bilateral upper traps and lumbar paraspinals   CERVICAL ROM:  WNL and pain free   UPPER EXTREMITY ROM:   WNL and pain free   LUMBAR AROM: WNL and pain free   UE/LE MMT:  MMT Right eval Left eval  Hip flexion 3+ 3+  Hip extension 4 4  Hip abduction 4 4  Shoulder flexion  5 5  Shoulder abduction 5 5  Shoulder internal rotation 5 5  Shoulder external rotation 5 5  Middle trapezius 4 4  Lower trapezius 3+ 3+     SPECIAL TESTS:  N/A   FUNCTIONAL TESTS:  N/A   TODAY'S TREATMENT:  [facility] Adult PT Treatment:                                                DATE: 04/21/22 Therapeutic Exercise: Demonstrated and issue initial HEP.    Therapeutic Activity: Education on assessment findings that will be addressed throughout duration of POC.       PATIENT EDUCATION:  Education details: see treatment  Person educated: Patient Education method: Explanation, Demonstration, Tactile cues, Verbal cues, and  Handouts Education comprehension: verbalized understanding, returned demonstration, verbal cues required, tactile cues required, and needs further education   HOME EXERCISE PROGRAM: Access Code: [email or web address] URL: [email or web address] Date: 04/21/2022 Prepared by: Gwendolyn Grant  Exercises - Sidelying Thoracic Rotation with Open Book  - 1 x daily - 7 x weekly - 2 sets - 10 reps - Cat Cow  - 1 x daily - 7 x weekly - 2 sets - 10 reps - Seated Scapular Retraction  - 1 x daily - 7 x weekly - 2 sets - 10 reps - Doorway Pec Stretch at 90 Degrees Abduction  - 1 x daily - 7 x weekly - 3 sets - 30 sec  hold - Seated Upper Trapezius Stretch  - 1 x daily - 7 x weekly - 3 sets - 30 sec  hold  ASSESSMENT:  CLINICAL IMPRESSION: Patient is a 61 y.o. female who was seen today for physical therapy evaluation and treatment for chronic neck and back pain that has been ongoing for years attributed to her breast size. Upon assessment she is noted to have normal and pain free lumbar AROM, weakness present about periscapular, core, and hip musculature, as well as postural abnormalities demonstrating forward head/rounded shoulders and decreased lumbar lordosis. She will benefit from skilled PT to address the above stated deficits in order to optimize her function as she prepares for breast reduction surgery    OBJECTIVE IMPAIRMENTS decreased strength, increased fascial restrictions, postural dysfunction, and pain.   ACTIVITY LIMITATIONS  able to tolerate all activities at this time  PARTICIPATION LIMITATIONS:  recreation  PERSONAL FACTORS Age, Fitness, Profession, and Time since onset of injury/illness/exacerbation are also affecting patient's functional outcome.   REHAB POTENTIAL: Excellent  CLINICAL DECISION MAKING: Stable/uncomplicated  EVALUATION COMPLEXITY: Low   GOALS: Goals reviewed with patient? No   LONG TERM GOALS: Target date: 06/02/2022  Patient will demonstrate 5/5 bilateral  hip strength to improve overall lumbopelvic stability.  Baseline: see above  Goal status: INITIAL  2.  Patient will demonstrate knowledge and application of appropriate sitting posture to reduce stress on her neck and back.  Baseline: see above Goal status: INITIAL  3.  Patient will be independent with advanced home program to progress/maintain current level of function.  Baseline: initial HEP issued  Goal status: INITIAL  4.  Patient will demonstrate 5/5 bilateral middle trap strength to improve scapular stability.  Baseline: see above  Goal status: INITIAL    PLAN: PT FREQUENCY: 1x/week  PT DURATION: 6 weeks  PLANNED INTERVENTIONS: Therapeutic exercises, Therapeutic activity, Neuromuscular re-education, Patient/Family education, Dry Needling, Electrical stimulation, Cryotherapy, Moist heat, Manual therapy, and Re-evaluation  PLAN FOR NEXT SESSION: review HEP and progress PRN, TPDN to upper traps, posture education, general core and periscapular strengthening  Gwendolyn Grant, PT, DPT, ATC 04/21/22 12:35 PM

## 2022-04-21 ENCOUNTER — Ambulatory Visit: Payer: BC Managed Care – PPO | Attending: Plastic Surgery

## 2022-04-21 DIAGNOSIS — N62 Hypertrophy of breast: Secondary | ICD-10-CM | POA: Diagnosis not present

## 2022-04-21 DIAGNOSIS — R21 Rash and other nonspecific skin eruption: Secondary | ICD-10-CM | POA: Insufficient documentation

## 2022-04-21 DIAGNOSIS — M6281 Muscle weakness (generalized): Secondary | ICD-10-CM

## 2022-04-21 DIAGNOSIS — M5459 Other low back pain: Secondary | ICD-10-CM | POA: Diagnosis not present

## 2022-04-21 DIAGNOSIS — M542 Cervicalgia: Secondary | ICD-10-CM

## 2022-04-21 DIAGNOSIS — R293 Abnormal posture: Secondary | ICD-10-CM | POA: Diagnosis not present

## 2022-04-25 ENCOUNTER — Ambulatory Visit: Payer: BC Managed Care – PPO | Admitting: Physical Therapy

## 2022-04-25 ENCOUNTER — Other Ambulatory Visit: Payer: Self-pay

## 2022-04-25 ENCOUNTER — Encounter: Payer: Self-pay | Admitting: Physical Therapy

## 2022-04-25 DIAGNOSIS — N62 Hypertrophy of breast: Secondary | ICD-10-CM | POA: Diagnosis not present

## 2022-04-25 DIAGNOSIS — R21 Rash and other nonspecific skin eruption: Secondary | ICD-10-CM | POA: Diagnosis not present

## 2022-04-25 DIAGNOSIS — M6281 Muscle weakness (generalized): Secondary | ICD-10-CM | POA: Diagnosis not present

## 2022-04-25 DIAGNOSIS — R293 Abnormal posture: Secondary | ICD-10-CM

## 2022-04-25 DIAGNOSIS — M542 Cervicalgia: Secondary | ICD-10-CM

## 2022-04-25 DIAGNOSIS — M5459 Other low back pain: Secondary | ICD-10-CM

## 2022-04-25 NOTE — Therapy (Signed)
OUTPATIENT PHYSICAL THERAPY TREATMENT NOTE   Patient Name: Krista Harrison MRN: [id number] DOB:[birth year], 61 y.o., female Today's Date: 04/26/2022  PCP: Juline Patch, MD   REFERRING PROVIDER: Lennice Sites, MD    END OF SESSION:   PT End of Session - 04/25/22 1622     Visit Number 2    Number of Visits 7    Date for PT Re-Evaluation 06/03/22    Authorization Type BCBS    PT Start Time 1615    PT Stop Time 0370    PT Time Calculation (min) 40 min    Activity Tolerance Patient tolerated treatment well    Behavior During Therapy WFL for tasks assessed/performed             Past Medical History:  Diagnosis Date   Anxiety    Edema of both lower extremities    Factor V Leiden ([place])    heterozygous   GERD (gastroesophageal reflux disease)    Hypertension    Past Surgical History:  Procedure Laterality Date   ABDOMINAL HYSTERECTOMY  2009   ROTATOR CUFF REPAIR Bilateral 2005, 2006   Patient Active Problem List   Diagnosis Date Noted   Morbid obesity ([place]) 10/28/2020   Prediabetes 10/26/2020   Vitamin D deficiency 10/26/2020   Pure hypercholesterolemia 04/04/2018   Rash, skin 04/04/2018   BRCA gene mutation negative 03/21/2017   Essential hypertension 09/25/2016   Gastroesophageal reflux disease 09/25/2016   Acute anxiety 09/25/2016    REFERRING DIAG: Neck, back and shoulder pain due to mammary hypertrophy   THERAPY DIAG:  Cervicalgia  Other low back pain  Muscle weakness (generalized)  Abnormal posture  Rationale for Evaluation and Treatment Rehabilitation  PERTINENT HISTORY: History of bilateral Rotator cuff repair 2004 and 2005  PRECAUTIONS: None  SUBJECTIVE: Patient reports she is doing well, not really any changes in symptoms. She has been consistent with her exercises.  PAIN:  Are you having pain? Yes:  NPRS scale: 0/10 Pain location: Neck and back Pain description: Constant, tension Aggravating factors: Sitting extended periods,  general activity Relieving factors: Rest  PATIENT GOALS "I need to get my body strong to accommodate a surgery."   OBJECTIVE: (objective measures completed at initial evaluation unless otherwise dated) POSTURE:  Rounded shoulders and forward head; slump sitting posture    PALPATION: TTP bilateral upper traps and lumbar paraspinals            UE/LE MMT:   MMT Right eval Left eval  Hip flexion 3+ 3+  Hip extension 4 4  Hip abduction 4 4  Shoulder flexion  5 5  Shoulder abduction 5 5  Shoulder internal rotation 5 5  Shoulder external rotation 5 5  Middle trapezius 4 4  Lower trapezius 3+ 3+      TODAY'S TREATMENT:  [facility] Adult PT Treatment:                                                DATE: 04/25/2022 Therapeutic Exercise: UBE L2 x 4 min (2 fwd/bwd) while taking subjective Seated upper trap stretch 2 x 20 sec each Sidelying thoracic rotation x 10 each Supine horizontal abduction with green 2 x 10 Seated horizontal abduction with green x 10 Double ER and scap retraction with green 2 x 10 Row with blue 2 x 15 Extension with green 2 x 15  Quadruped scapular protraction/retraction x 10 Counter push-up plus 2 x 10 Counter step back stretch 2 x 3 with 5 sec hold LTR x 10 Bridge 2 x 10 90-90 table top hold 2 x 5 with 5 sec hold    PATIENT EDUCATION:  Education details: HEP update Person educated: Patient Education method: Explanation, Demonstration, Tactile cues, Verbal cues, and Handouts Education comprehension: verbalized understanding, returned demonstration, verbal cues required, tactile cues required, and needs further education   HOME EXERCISE PROGRAM: Access Code: [email or web address]    ASSESSMENT: CLINICAL IMPRESSION: Patient tolerated therapy well with no adverse effects. Therapy focused on continued progression of mobility and incorporated more postural strengthening exercises with resistance. She did not report any increase in pain with exercises this visit. She does  require consistent cueing for postural control with exercises. Updated her HEP to include resistance exercises at home. She will benefit from skilled PT to address her impairments in order to optimize her function as she prepares for breast reduction surgery.     OBJECTIVE IMPAIRMENTS decreased strength, increased fascial restrictions, postural dysfunction, and pain.    ACTIVITY LIMITATIONS  able to tolerate all activities at this time   PARTICIPATION LIMITATIONS:  recreation   PERSONAL FACTORS Age, Fitness, Profession, and Time since onset of injury/illness/exacerbation are also affecting patient's functional outcome.      GOALS: Goals reviewed with patient? No    LONG TERM GOALS: Target date: 06/02/2022   Patient will demonstrate 5/5 bilateral hip strength to improve overall lumbopelvic stability.  Baseline: see above  Goal status: INITIAL   2.  Patient will demonstrate knowledge and application of appropriate sitting posture to reduce stress on her neck and back.  Baseline: see above Goal status: INITIAL   3.  Patient will be independent with advanced home program to progress/maintain current level of function.  Baseline: initial HEP issued  Goal status: INITIAL   4.  Patient will demonstrate 5/5 bilateral middle trap strength to improve scapular stability.  Baseline: see above  Goal status: INITIAL     PLAN: PT FREQUENCY: 1x/week   PT DURATION: 6 weeks   PLANNED INTERVENTIONS: Therapeutic exercises, Therapeutic activity, Neuromuscular re-education, Patient/Family education, Dry Needling, Electrical stimulation, Cryotherapy, Moist heat, Manual therapy, and Re-evaluation   PLAN FOR NEXT SESSION: review HEP and progress PRN, TPDN to upper traps, posture education, general core and periscapular strengthening    Hilda Blades, PT, DPT, LAT, ATC 04/26/22  10:05 AM Phone: [phone] Fax: [phone]

## 2022-05-01 NOTE — Therapy (Signed)
OUTPATIENT PHYSICAL THERAPY TREATMENT NOTE   Patient Name: Krista Harrison MRN: [id number] DOB:04/15/1961, 61 y.o., female Today's Date: 05/02/2022  PCP: Juline Patch, MD   REFERRING PROVIDER: Lennice Sites, MD    END OF SESSION:   PT End of Session - 05/02/22 1058     Visit Number 3    Number of Visits 7    Date for PT Re-Evaluation 06/03/22    Authorization Type BCBS    PT Start Time 1100    PT Stop Time 1143    PT Time Calculation (min) 43 min    Activity Tolerance Patient tolerated treatment well    Behavior During Therapy WFL for tasks assessed/performed              Past Medical History:  Diagnosis Date   Anxiety    Edema of both lower extremities    Factor V Leiden ([place])    heterozygous   GERD (gastroesophageal reflux disease)    Hypertension    Past Surgical History:  Procedure Laterality Date   ABDOMINAL HYSTERECTOMY  2009   ROTATOR CUFF REPAIR Bilateral 2005, 2006   Patient Active Problem List   Diagnosis Date Noted   Morbid obesity ([place]) 10/28/2020   Prediabetes 10/26/2020   Vitamin D deficiency 10/26/2020   Pure hypercholesterolemia 04/04/2018   Rash, skin 04/04/2018   BRCA gene mutation negative 03/21/2017   Essential hypertension 09/25/2016   Gastroesophageal reflux disease 09/25/2016   Acute anxiety 09/25/2016    REFERRING DIAG: Neck, back and shoulder pain due to mammary hypertrophy   THERAPY DIAG:  Cervicalgia  Other low back pain  Muscle weakness (generalized)  Abnormal posture  Rationale for Evaluation and Treatment Rehabilitation  PERTINENT HISTORY: History of bilateral Rotator cuff repair 2004 and 2005  PRECAUTIONS: None  SUBJECTIVE: Patient reports she is doing well today without pain. She reports compliance with HEP.   PAIN:  Are you having pain? None currently:  NPRS scale: 0/10 currently; at worst: 5/10  Pain location: Neck and back Pain description:  tension Aggravating factors: Sitting extended  periods, general activity Relieving factors: Rest  PATIENT GOALS "I need to get my body strong to accommodate a surgery."   OBJECTIVE: (objective measures completed at initial evaluation unless otherwise dated) POSTURE:  Rounded shoulders and forward head; slump sitting posture    PALPATION: TTP bilateral upper traps and lumbar paraspinals            UE/LE MMT:   MMT Right eval Left eval  Hip flexion 3+ 3+  Hip extension 4 4  Hip abduction 4 4  Shoulder flexion  5 5  Shoulder abduction 5 5  Shoulder internal rotation 5 5  Shoulder external rotation 5 5  Middle trapezius 4 4  Lower trapezius 3+ 3+      TODAY'S TREATMENT:  [facility] Adult PT Treatment:                                                DATE: 05/02/22 Therapeutic Exercise: UBE level 3, 2 min each fwd/bwd  Supine pelvic tilts 2 x 10  Figure 4 stretch x 60 sec each  Supine TA march 2 x 10  Hip bridge with abduction blue band 2 x 10  Sidelying hip abduction 2 x 15  Serratus wall slides 2 x 15  Standing resisted horizontal abduction red  band 2 x 15 Quadruped single arm reach 2 x 10   Push up at counter 2 x 10  Resisted rows 45 lbs 3 x 10  Sidelying hip circles CW/CCW 2 x 10 bilateral  Diaphragmatic breathing x 10  Updated HEP  [facility] Adult PT Treatment:                                                DATE: 04/25/2022 Therapeutic Exercise: UBE L2 x 4 min (2 fwd/bwd) while taking subjective Seated upper trap stretch 2 x 20 sec each Sidelying thoracic rotation x 10 each Supine horizontal abduction with green 2 x 10 Seated horizontal abduction with green x 10 Double ER and scap retraction with green 2 x 10 Row with blue 2 x 15 Extension with green 2 x 15 Quadruped scapular protraction/retraction x 10 Counter push-up plus 2 x 10 Counter step back stretch 2 x 3 with 5 sec hold LTR x 10 Bridge 2 x 10 90-90 table top hold 2 x 5 with 5 sec hold    PATIENT EDUCATION:  Education details: HEP update Person educated:  Patient Education method: Explanation, Demonstration, Tactile cues, Verbal cues, and Handouts Education comprehension: verbalized understanding, returned demonstration, verbal cues required, tactile cues required, and needs further education   HOME EXERCISE PROGRAM: Access Code: [email or web address]    ASSESSMENT: CLINICAL IMPRESSION: Patient tolerated session well today without reports of pain. Able to introduce proximal hip and core strengthening today along with progression of periscapular strengthening. She demonstrates good lumbopelvic stability with core/hip strengthening, requiring cues for pacing with majority of these exercises. Occasional postural cues required with strengthening. With diaphragmatic breathing she has difficulty controlling for excessive engagement of accessory musculature.      OBJECTIVE IMPAIRMENTS decreased strength, increased fascial restrictions, postural dysfunction, and pain.    ACTIVITY LIMITATIONS  able to tolerate all activities at this time   PARTICIPATION LIMITATIONS:  recreation   PERSONAL FACTORS Age, Fitness, Profession, and Time since onset of injury/illness/exacerbation are also affecting patient's functional outcome.      GOALS: Goals reviewed with patient? No    LONG TERM GOALS: Target date: 06/02/2022   Patient will demonstrate 5/5 bilateral hip strength to improve overall lumbopelvic stability.  Baseline: see above  Goal status: INITIAL   2.  Patient will demonstrate knowledge and application of appropriate sitting posture to reduce stress on her neck and back.  Baseline: see above Goal status: INITIAL   3.  Patient will be independent with advanced home program to progress/maintain current level of function.  Baseline: initial HEP issued  Goal status: INITIAL   4.  Patient will demonstrate 5/5 bilateral middle trap strength to improve scapular stability.  Baseline: see above  Goal status: INITIAL     PLAN: PT FREQUENCY: 1x/week   PT  DURATION: 6 weeks   PLANNED INTERVENTIONS: Therapeutic exercises, Therapeutic activity, Neuromuscular re-education, Patient/Family education, Dry Needling, Electrical stimulation, Cryotherapy, Moist heat, Manual therapy, and Re-evaluation   PLAN FOR NEXT SESSION: review HEP and progress PRN, TPDN to upper traps, posture education, general core and periscapular strengthening  Gwendolyn Grant, PT, DPT, ATC 05/02/22 11:46 AM

## 2022-05-02 ENCOUNTER — Ambulatory Visit: Payer: BC Managed Care – PPO

## 2022-05-02 DIAGNOSIS — M6281 Muscle weakness (generalized): Secondary | ICD-10-CM | POA: Diagnosis not present

## 2022-05-02 DIAGNOSIS — R21 Rash and other nonspecific skin eruption: Secondary | ICD-10-CM | POA: Diagnosis not present

## 2022-05-02 DIAGNOSIS — N62 Hypertrophy of breast: Secondary | ICD-10-CM | POA: Diagnosis not present

## 2022-05-02 DIAGNOSIS — M542 Cervicalgia: Secondary | ICD-10-CM | POA: Diagnosis not present

## 2022-05-02 DIAGNOSIS — M5459 Other low back pain: Secondary | ICD-10-CM | POA: Diagnosis not present

## 2022-05-02 DIAGNOSIS — R293 Abnormal posture: Secondary | ICD-10-CM

## 2022-05-16 ENCOUNTER — Ambulatory Visit (INDEPENDENT_AMBULATORY_CARE_PROVIDER_SITE_OTHER): Payer: BC Managed Care – PPO | Admitting: Family Medicine

## 2022-05-16 ENCOUNTER — Encounter (INDEPENDENT_AMBULATORY_CARE_PROVIDER_SITE_OTHER): Payer: Self-pay | Admitting: Family Medicine

## 2022-05-16 VITALS — BP 140/87 | HR 77 | Temp 97.8°F | Ht 61.0 in | Wt 263.0 lb

## 2022-05-16 DIAGNOSIS — R0602 Shortness of breath: Secondary | ICD-10-CM

## 2022-05-16 DIAGNOSIS — Z9189 Other specified personal risk factors, not elsewhere classified: Secondary | ICD-10-CM

## 2022-05-16 DIAGNOSIS — R5383 Other fatigue: Secondary | ICD-10-CM

## 2022-05-16 DIAGNOSIS — F5089 Other specified eating disorder: Secondary | ICD-10-CM

## 2022-05-16 DIAGNOSIS — E559 Vitamin D deficiency, unspecified: Secondary | ICD-10-CM | POA: Diagnosis not present

## 2022-05-16 DIAGNOSIS — I10 Essential (primary) hypertension: Secondary | ICD-10-CM | POA: Diagnosis not present

## 2022-05-16 DIAGNOSIS — Z1331 Encounter for screening for depression: Secondary | ICD-10-CM

## 2022-05-16 DIAGNOSIS — R7303 Prediabetes: Secondary | ICD-10-CM

## 2022-05-16 DIAGNOSIS — Z6841 Body Mass Index (BMI) 40.0 and over, adult: Secondary | ICD-10-CM

## 2022-05-17 LAB — COMPREHENSIVE METABOLIC PANEL
ALT: 22 IU/L (ref 0–32)
AST: 22 IU/L (ref 0–40)
Albumin/Globulin Ratio: 2 (ref 1.2–2.2)
Albumin: 4.7 g/dL (ref 3.8–4.9)
Alkaline Phosphatase: 85 IU/L (ref 44–121)
BUN/Creatinine Ratio: 15 (ref 12–28)
BUN: 13 mg/dL (ref 8–27)
Bilirubin Total: 0.3 mg/dL (ref 0.0–1.2)
CO2: 23 mmol/L (ref 20–29)
Calcium: 9.5 mg/dL (ref 8.7–10.3)
Chloride: 98 mmol/L (ref 96–106)
Creatinine, Ser: 0.89 mg/dL (ref 0.57–1.00)
Globulin, Total: 2.4 g/dL (ref 1.5–4.5)
Glucose: 101 mg/dL — ABNORMAL HIGH (ref 70–99)
Potassium: 3.8 mmol/L (ref 3.5–5.2)
Sodium: 143 mmol/L (ref 134–144)
Total Protein: 7.1 g/dL (ref 6.0–8.5)
eGFR: 74 mL/min/{1.73_m2} (ref >59)

## 2022-05-17 LAB — T4, FREE: Free T4: 1.11 ng/dL (ref 0.82–1.77)

## 2022-05-17 LAB — CBC WITH DIFFERENTIAL/PLATELET
Basophils Absolute: 0.1 10*3/uL (ref 0.0–0.2)
Basos: 1 %
EOS (ABSOLUTE): 0.2 10*3/uL (ref 0.0–0.4)
Eos: 2 %
Hematocrit: 43.9 % (ref 34.0–46.6)
Hemoglobin: 14.6 g/dL (ref 11.1–15.9)
Immature Grans (Abs): 0 10*3/uL (ref 0.0–0.1)
Immature Granulocytes: 0 %
Lymphocytes Absolute: 2.1 10*3/uL (ref 0.7–3.1)
Lymphs: 26 %
MCH: 31.5 pg (ref 26.6–33.0)
MCHC: 33.3 g/dL (ref 31.5–35.7)
MCV: 95 fL (ref 79–97)
Monocytes Absolute: 0.6 10*3/uL (ref 0.1–0.9)
Monocytes: 8 %
Neutrophils Absolute: 5.2 10*3/uL (ref 1.4–7.0)
Neutrophils: 63 %
Platelets: 294 10*3/uL (ref 150–450)
RBC: 4.63 x10E6/uL (ref 3.77–5.28)
RDW: 12.9 % (ref 11.7–15.4)
WBC: 8.2 10*3/uL (ref 3.4–10.8)

## 2022-05-17 LAB — LIPID PANEL WITH LDL/HDL RATIO
Cholesterol, Total: 257 mg/dL — ABNORMAL HIGH (ref 100–199)
HDL: 62 mg/dL (ref >39)
LDL Chol Calc (NIH): 164 mg/dL — ABNORMAL HIGH (ref 0–99)
LDL/HDL Ratio: 2.6 ratio (ref 0.0–3.2)
Triglycerides: 171 mg/dL — ABNORMAL HIGH (ref 0–149)
VLDL Cholesterol Cal: 31 mg/dL (ref 5–40)

## 2022-05-17 LAB — FOLATE: Folate: 11 ng/mL (ref >3.0)

## 2022-05-17 LAB — TSH: TSH: 3.44 u[IU]/mL (ref 0.450–4.500)

## 2022-05-17 LAB — HEMOGLOBIN A1C
Est. average glucose Bld gHb Est-mCnc: 128 mg/dL
Hgb A1c MFr Bld: 6.1 % — ABNORMAL HIGH (ref 4.8–5.6)

## 2022-05-17 LAB — VITAMIN B12: Vitamin B-12: 453 pg/mL (ref 232–1245)

## 2022-05-17 LAB — INSULIN, RANDOM: INSULIN: 12 u[IU]/mL (ref 2.6–24.9)

## 2022-05-17 LAB — VITAMIN D 25 HYDROXY (VIT D DEFICIENCY, FRACTURES): Vit D, 25-Hydroxy: 27.6 ng/mL — ABNORMAL LOW (ref 30.0–100.0)

## 2022-05-18 ENCOUNTER — Ambulatory Visit: Payer: BC Managed Care – PPO | Admitting: Physical Therapy

## 2022-05-18 NOTE — Progress Notes (Signed)
Chief Complaint:   OBESITY Krista Harrison (MR# [id number]) is a 61 y.o. female who presents for evaluation and treatment of obesity and related comorbidities. Current BMI is Body mass index is 49.69 kg/m. Krista Harrison has been struggling with her weight for many years and has been unsuccessful in either losing weight, maintaining weight loss, or reaching her healthy weight goal.  Krista Harrison is currently in the action stage of change and ready to dedicate time achieving and maintaining a healthier weight. Krista Harrison is interested in becoming our patient and working on intensive lifestyle modifications including (but not limited to) diet and exercise for weight loss.  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 She is here because she needs a breast reduction surgery and BMI needs to be <40. She was here a couple of years ago and saw Dr. Owens Shark 12/21/2020. She has a husband, Krista Harrison. Pt also has a Physiological scientist that she signed up with a month and a half ago, whom she trains with 2 days a week. She goes to [employer] 3 days a week. Her goal is to exercise 5 days a week.  Krista Harrison habits were reviewed today and are as follows: Her family eats meals together, she thinks her family will eat healthier with her, her desired weight loss is 113 lbs, she has been heavy most of her life, she started gaining weight in her 109's, her heaviest weight ever was TODAY, 263 pounds, she has significant food cravings issues, she skips meals frequently, she frequently makes poor food choices, she frequently eats larger portions than normal, and she struggles with emotional eating.  Depression Screen Krista Harrison's Food and Mood (modified PHQ-9) score was 6.     05/16/2022    9:12 AM  Depression screen PHQ 2/9  Decreased Interest 2  Down, Depressed, Hopeless 0  PHQ - 2 Score 2  Altered sleeping 1  Tired, decreased energy 1  Change in appetite 1  Feeling bad or failure about yourself  0  Trouble concentrating 0  Moving  slowly or fidgety/restless 1  Suicidal thoughts 0  PHQ-9 Score 6  Difficult doing work/chores Not difficult at all   Subjective:   1. Other fatigue Krista Harrison denies daytime somnolence and admits to waking up still tired. Patient has a history of symptoms of morning fatigue. Krista Harrison generally gets 5 or 6 hours of sleep per night, and states that she has generally restful sleep. Snoring is present. Apneic episodes are not present. Epworth Sleepiness Score is 1. PHQ score is 6 and pt has no concerns with depression or mood disorder.   2. SOB (shortness of breath) on exertion Krista Harrison notes increasing shortness of breath with exercising and seems to be worsening over time with weight gain. She notes getting out of breath sooner with activity than she used to. This has gotten worse recently. Krista Harrison denies shortness of breath at rest or orthopnea.  3. Prediabetes Krista Harrison was first diagnosed a couple of years ago. She loves carbs (pasta, rice, tortilla chips, all [place]). She is asymptomatic and has never been on meds for disease.  4. Essential hypertension Pt's compliance with BP meds is good and she has no concerns. Medication: lisinopril  5. Vitamin D deficiency She has history of Vit D supplementation in the past but hasn't been on them for a while. Pt does report fatigue.  6. Other disorder of eating Pt "stress eats carbs." She doesn't feel it will hinder her journey of weight loss  at this time.  7. At risk for diabetes mellitus Krista Harrison is at risk for diabetes mellitus due to prediabetes, morbid obesity, and current eating habits.  Assessment/Plan:   Orders Placed This Encounter  Procedures   VITAMIN D 25 Hydroxy (Vit-D Deficiency, Fractures)   TSH   T4, free   Lipid Panel With LDL/HDL Ratio   Insulin, random   Hemoglobin A1c   Folate   Comprehensive metabolic panel   CBC with Differential/Platelet   Vitamin B12    Medications Discontinued During This Encounter  Medication Reason    ALPRAZolam (XANAX) 0.25 MG tablet    nitrofurantoin, macrocrystal-monohydrate, (MACROBID) 100 MG capsule      No orders of the defined types were placed in this encounter.    1. Other fatigue Krista Harrison does feel that her weight is causing her energy to be lower than it should be. Fatigue may be related to obesity, depression or many other causes. Labs will be ordered, and in the meanwhile, Krista Harrison will focus on self care including making healthy food choices, increasing physical activity and focusing on stress reduction. ECG done 05/2021 without acute changes. No need to repeat today, but reviewed today.  Lab/Orders today: - TSH - T4, free - Folate - Vitamin B12  2. SOB (shortness of breath) on exertion Krista Harrison does feel that she gets out of breath more easily that she used to when she exercises. Krista Harrison's shortness of breath appears to be obesity related and exercise induced. She has agreed to work on weight loss and gradually increase exercise to treat her exercise induced shortness of breath. Will continue to monitor closely.  3. Prediabetes Krista Harrison will continue to work on prudent nutritional plan, weight loss, exercise, and decreasing simple carbohydrates to help decrease the risk of diabetes.   Lab/Orders today: - Insulin, random - Hemoglobin A1c  4. Essential hypertension BP not at goal of <140/90.  Krista Harrison on pathophysiology of disease and discussed treatment plan, which always includes dietary and lifestyle modification as first line.  Lifestyle changes such as following our low salt, heart healthy meal plan and engaging in a regular exercise program discussed  - Avoid buying foods that are: processed, frozen, or prepackaged to avoid excess salt. - Ambulatory blood pressure monitoring encouraged.  Reminded patient that if they ever feel poorly in any way, to check their blood pressure and pulse as well. - We will continue to monitor closely alongside PCP/ specialists.   Pt reminded to also f/up with those individuals as instructed by them.  - We will continue to monitor symptoms as they relate to the her weight loss journey.  Lab/Orders today: - Lipid Panel With LDL/HDL Ratio - Comprehensive metabolic panel - CBC with Differential/Platelet  5. Vitamin D deficiency Low Vitamin D level contributes to fatigue and are associated with obesity, breast, and colon cancer. She will follow-up for routine testing of Vitamin D, at least 2-3 times per year to avoid over-replacement. We will supplement if needed pending lab results.  Lab/Orders today: - VITAMIN D 25 Hydroxy (Vit-D Deficiency, Fractures)  6. Other disorder of eating Behavior modification techniques were discussed today to help April deal with her emotional/non-hunger eating behaviors.  Orders and follow up as documented in patient record. Pt declines referral to Dr. Mallie Mussel today but will consider in the future as needed. We will monitor closely.  Handout: Mindful Eating Guide  7. Depression screen Fidelia had a positive depression screening. Depression is commonly associated with obesity and often  results in emotional eating behaviors. We will monitor this closely and work on CBT to help improve the non-hunger eating patterns. Referral to Psychology may be required if no improvement is seen as she continues in our clinic.  8. At risk for diabetes mellitus - Addilyn was given diabetes prevention education and counseling today of more than >23 minutes.  - Counseled patient on pathophysiology of disease and meaning/ implication of lab results.  - Reviewed how certain foods can either stimulate or inhibit insulin release, and subsequently affect hunger pathways  - Importance of following a healthy meal plan with limiting amounts of simple carbohydrates discussed with patient - Effects of regular aerobic exercise on blood sugar regulation reviewed and encouraged an eventual goal of 30 min 5d/week or more as a  minimum.  - Briefly discussed treatment options, which always include dietary and lifestyle modification as first line.   - Handouts provided at patient's desire and/or told to go online to the American Diabetes Association website for further information.  9. Class 3 severe obesity with serious comorbidity and body mass index (BMI) of 45.0 to 49.9 in adult, unspecified obesity type [facility]) [place] is currently in the action stage of change and her goal is to continue with weight loss efforts. I recommend Yalissa begin the structured treatment plan as follows:  She has agreed to the Category 3 Plan with breakfast options.  Exercise goals:  As is    Behavioral modification strategies: increasing lean protein intake, decreasing simple carbohydrates, and planning for success.  She was informed of the importance of frequent follow-up visits to maximize her success with intensive lifestyle modifications for her multiple health conditions. She was informed we would discuss her lab results at her next visit unless there is a critical issue that needs to be addressed sooner. [place] agreed to keep her next visit at the agreed upon time to discuss these results.  Objective:   Blood pressure (!) 140/87, pulse 77, temperature 97.8 F (36.6 C), height '5\' 1"'$  (1.549 m), weight 263 lb (119.3 kg), SpO2 97 %. Body mass index is 49.69 kg/m.  EKG: Normal sinus rhythm, rate 96 (8/22).  Indirect Calorimeter completed today shows a VO2 of 333 and a REE of 2304.  Her calculated basal metabolic rate is 2841 thus her basal metabolic rate is better than expected.  General: Cooperative, alert, well developed, in no acute distress. HEENT: Conjunctivae and lids unremarkable. Cardiovascular: Regular rhythm.  Lungs: Normal work of breathing. Neurologic: No focal deficits.   Lab Results  Component Value Date   CREATININE 0.89 05/16/2022   BUN 13 05/16/2022   NA 143 05/16/2022   K 3.8 05/16/2022   CL 98 05/16/2022    CO2 23 05/16/2022   Lab Results  Component Value Date   ALT 22 05/16/2022   AST 22 05/16/2022   ALKPHOS 85 05/16/2022   BILITOT 0.3 05/16/2022   Lab Results  Component Value Date   HGBA1C 6.1 (H) 05/16/2022   HGBA1C 6.4 (H) 01/16/2022   HGBA1C 6.5 (H) 10/12/2021   HGBA1C 6.4 (H) 10/25/2020   Lab Results  Component Value Date   INSULIN 12.0 05/16/2022   INSULIN 19.2 10/25/2020   Lab Results  Component Value Date   TSH 3.440 05/16/2022   Lab Results  Component Value Date   CHOL 257 (H) 05/16/2022   HDL 62 05/16/2022   LDLCALC 164 (H) 05/16/2022   TRIG 171 (H) 05/16/2022   CHOLHDL 4.1 01/16/2022   Lab Results  Component Value Date   WBC 8.2 05/16/2022   HGB 14.6 05/16/2022   HCT 43.9 05/16/2022   MCV 95 05/16/2022   PLT 294 05/16/2022    Attestation Statements:   Reviewed by clinician on day of visit: allergies, medications, problem list, medical history, surgical history, family history, social history, and previous encounter notes.  I, Kathlene November, BS, CMA, am acting as transcriptionist for [employer], DO.  I have reviewed the above documentation for accuracy and completeness, and I agree with the above. Marjory Sneddon, D.O.  The [place] was signed into law in 2016 which includes the topic of electronic health records.  This provides immediate access to information in MyChart.  This includes consultation notes, operative notes, office notes, lab results and pathology reports.  If you have any questions about what you read please let us know at your next visit so we can discuss your concerns and take corrective action if need be.  We are right here with you.

## 2022-05-23 ENCOUNTER — Ambulatory Visit: Payer: BC Managed Care – PPO | Admitting: Family Medicine

## 2022-05-23 ENCOUNTER — Telehealth: Payer: Self-pay

## 2022-05-23 ENCOUNTER — Ambulatory Visit: Payer: BC Managed Care – PPO

## 2022-05-23 NOTE — Telephone Encounter (Signed)
Spoke with patient regarding missed PT appointment. She forgot about scheduled visit and has plans to call the office later to reschedule.   Gwendolyn Grant, PT, DPT, ATC 05/23/22 11:30 AM

## 2022-05-24 ENCOUNTER — Ambulatory Visit: Payer: BC Managed Care – PPO | Attending: Plastic Surgery

## 2022-05-24 ENCOUNTER — Encounter (INDEPENDENT_AMBULATORY_CARE_PROVIDER_SITE_OTHER): Payer: Self-pay

## 2022-05-24 DIAGNOSIS — M5459 Other low back pain: Secondary | ICD-10-CM | POA: Insufficient documentation

## 2022-05-24 DIAGNOSIS — R293 Abnormal posture: Secondary | ICD-10-CM | POA: Diagnosis not present

## 2022-05-24 DIAGNOSIS — M6281 Muscle weakness (generalized): Secondary | ICD-10-CM | POA: Diagnosis not present

## 2022-05-24 DIAGNOSIS — M542 Cervicalgia: Secondary | ICD-10-CM | POA: Diagnosis not present

## 2022-05-24 NOTE — Therapy (Signed)
OUTPATIENT PHYSICAL THERAPY TREATMENT NOTE   Patient Name: Krista Harrison MRN: [id number] DOB:[birth year], 61 y.o., female Today's Date: 05/24/2022  PCP: Juline Patch, MD   REFERRING PROVIDER: Lennice Sites, MD    END OF SESSION:   PT End of Session - 05/24/22 1703     Visit Number 4    Number of Visits 7    Date for PT Re-Evaluation 06/03/22    Authorization Type BCBS    PT Start Time 1703    PT Stop Time 5809    PT Time Calculation (min) 38 min    Activity Tolerance Patient tolerated treatment well    Behavior During Therapy WFL for tasks assessed/performed               Past Medical History:  Diagnosis Date   Anxiety    Edema of both lower extremities    Factor V Leiden ([place])    heterozygous   GERD (gastroesophageal reflux disease)    Hypertension    Large breasts    Obesity    Past Surgical History:  Procedure Laterality Date   ABDOMINAL HYSTERECTOMY  2009   ROTATOR CUFF REPAIR Bilateral 2005, 2006   Patient Active Problem List   Diagnosis Date Noted   Morbid obesity ([place]) 10/28/2020   Prediabetes 10/26/2020   Vitamin D deficiency 10/26/2020   Pure hypercholesterolemia 04/04/2018   Rash, skin 04/04/2018   BRCA gene mutation negative 03/21/2017   Essential hypertension 09/25/2016   Gastroesophageal reflux disease 09/25/2016   Acute anxiety 09/25/2016    REFERRING DIAG: Neck, back and shoulder pain due to mammary hypertrophy   THERAPY DIAG:  Cervicalgia  Other low back pain  Muscle weakness (generalized)  Abnormal posture  Rationale for Evaluation and Treatment Rehabilitation  PERTINENT HISTORY: History of bilateral Rotator cuff repair 2004 and 2005  PRECAUTIONS: None  SUBJECTIVE: Patient reports she is tired, but no pain currently. She had a workout with her personal trainer this morning.   PAIN:  Are you having pain? None currently:  NPRS scale: 0/10 currently; at worst: 5/10  Pain location: Neck and back Pain description:   tension Aggravating factors: Sitting extended periods, general activity Relieving factors: Rest  PATIENT GOALS "I need to get my body strong to accommodate a surgery."   OBJECTIVE: (objective measures completed at initial evaluation unless otherwise dated) POSTURE:  Rounded shoulders and forward head; slump sitting posture    PALPATION: TTP bilateral upper traps and lumbar paraspinals            UE/LE MMT:   MMT Right eval Left eval 05/24/22  Hip flexion 3+ 3+ 4+/5 bilaterally  Hip extension 4 4   Hip abduction 4 4   Shoulder flexion  5 5   Shoulder abduction 5 5   Shoulder internal rotation 5 5   Shoulder external rotation 5 5   Middle trapezius 4 4   Lower trapezius 3+ 3+       TODAY'S TREATMENT:  [facility] Adult PT Treatment:                                                DATE: 05/24/22 Therapeutic Exercise: UBE level 3 x 2 min each fwd/bwd Dead bug 3 x 20  Sideplank 2 x 20 sec  KB swings 2 x 10; 10 lbs  Lateral kettlebell lift 2 x  15; 10 lbs  Leg press 1 x 10 @ 40 lbs 1 x 10 60 lbs  Deadlift 5 lb kettlebell 2 x 10  Updated HEP  [facility] Adult PT Treatment:                                                DATE: 05/02/22 Therapeutic Exercise: UBE level 3, 2 min each fwd/bwd  Supine pelvic tilts 2 x 10  Figure 4 stretch x 60 sec each  Supine TA march 2 x 10  Hip bridge with abduction blue band 2 x 10  Sidelying hip abduction 2 x 15  Serratus wall slides 2 x 15  Standing resisted horizontal abduction red band 2 x 15 Quadruped single arm reach 2 x 10   Push up at counter 2 x 10  Resisted rows 45 lbs 3 x 10  Sidelying hip circles CW/CCW 2 x 10 bilateral  Diaphragmatic breathing x 10  Updated HEP  [facility] Adult PT Treatment:                                                DATE: 04/25/2022 Therapeutic Exercise: UBE L2 x 4 min (2 fwd/bwd) while taking subjective Seated upper trap stretch 2 x 20 sec each Sidelying thoracic rotation x 10 each Supine horizontal abduction with  green 2 x 10 Seated horizontal abduction with green x 10 Double ER and scap retraction with green 2 x 10 Row with blue 2 x 15 Extension with green 2 x 15 Quadruped scapular protraction/retraction x 10 Counter push-up plus 2 x 10 Counter step back stretch 2 x 3 with 5 sec hold LTR x 10 Bridge 2 x 10 90-90 table top hold 2 x 5 with 5 sec hold     PATIENT EDUCATION:  Education details: HEP update Person educated: Patient Education method: Consulting civil engineer, Demonstration, Tactile cues, Verbal cues, and Handouts Education comprehension: verbalized understanding, returned demonstration, verbal cues required, tactile cues required,    HOME EXERCISE PROGRAM: Access Code: [email or web address]    ASSESSMENT: CLINICAL IMPRESSION: Patient tolerated session well today with focus on progression of core and hip strengthening as patient reports focusing on her shoulders at her workout this morning. She quickly fatigues with core strengthening requiring rest breaks between sets. She demonstrates excellent form with squatting and dead lift. No reports of pain throughout session.      OBJECTIVE IMPAIRMENTS decreased strength, increased fascial restrictions, postural dysfunction, and pain.    ACTIVITY LIMITATIONS  able to tolerate all activities at this time   PARTICIPATION LIMITATIONS:  recreation   PERSONAL FACTORS Age, Fitness, Profession, and Time since onset of injury/illness/exacerbation are also affecting patient's functional outcome.      GOALS: Goals reviewed with patient? No    LONG TERM GOALS: Target date: 06/02/2022   Patient will demonstrate 5/5 bilateral hip strength to improve overall lumbopelvic stability.  Baseline: see above  Goal status: INITIAL   2.  Patient will demonstrate knowledge and application of appropriate sitting posture to reduce stress on her neck and back.  Baseline: see above Goal status: INITIAL   3.  Patient will be independent with advanced home program to  progress/maintain current level of function.  Baseline: initial HEP issued  Goal status: INITIAL   4.  Patient will demonstrate 5/5 bilateral middle trap strength to improve scapular stability.  Baseline: see above  Goal status: INITIAL     PLAN: PT FREQUENCY: 1x/week   PT DURATION: 6 weeks   PLANNED INTERVENTIONS: Therapeutic exercises, Therapeutic activity, Neuromuscular re-education, Patient/Family education, Dry Needling, Electrical stimulation, Cryotherapy, Moist heat, Manual therapy, and Re-evaluation   PLAN FOR NEXT SESSION: review HEP and progress PRN, TPDN to upper traps, posture education, general core and periscapular strengthening  Gwendolyn Grant, PT, DPT, ATC 05/24/22 5:42 PM

## 2022-05-30 ENCOUNTER — Ambulatory Visit (INDEPENDENT_AMBULATORY_CARE_PROVIDER_SITE_OTHER): Payer: BC Managed Care – PPO | Admitting: Family Medicine

## 2022-05-30 ENCOUNTER — Encounter (INDEPENDENT_AMBULATORY_CARE_PROVIDER_SITE_OTHER): Payer: Self-pay | Admitting: Family Medicine

## 2022-05-30 ENCOUNTER — Ambulatory Visit: Payer: BC Managed Care – PPO

## 2022-05-30 DIAGNOSIS — I10 Essential (primary) hypertension: Secondary | ICD-10-CM | POA: Diagnosis not present

## 2022-05-30 DIAGNOSIS — R7303 Prediabetes: Secondary | ICD-10-CM | POA: Diagnosis not present

## 2022-05-30 DIAGNOSIS — Z9189 Other specified personal risk factors, not elsewhere classified: Secondary | ICD-10-CM

## 2022-05-30 DIAGNOSIS — Z6841 Body Mass Index (BMI) 40.0 and over, adult: Secondary | ICD-10-CM

## 2022-05-30 DIAGNOSIS — M6281 Muscle weakness (generalized): Secondary | ICD-10-CM

## 2022-05-30 DIAGNOSIS — E559 Vitamin D deficiency, unspecified: Secondary | ICD-10-CM

## 2022-05-30 DIAGNOSIS — M5459 Other low back pain: Secondary | ICD-10-CM

## 2022-05-30 DIAGNOSIS — E669 Obesity, unspecified: Secondary | ICD-10-CM

## 2022-05-30 DIAGNOSIS — R293 Abnormal posture: Secondary | ICD-10-CM

## 2022-05-30 DIAGNOSIS — E7849 Other hyperlipidemia: Secondary | ICD-10-CM

## 2022-05-30 DIAGNOSIS — M542 Cervicalgia: Secondary | ICD-10-CM

## 2022-05-30 DIAGNOSIS — E662 Morbid (severe) obesity with alveolar hypoventilation: Secondary | ICD-10-CM

## 2022-05-30 MED ORDER — VITAMIN D (ERGOCALCIFEROL) 1.25 MG (50000 UNIT) PO CAPS: 50000.0000 [IU] | ORAL_CAPSULE | ORAL | 0 refills | Status: DC

## 2022-05-30 NOTE — Therapy (Signed)
OUTPATIENT PHYSICAL THERAPY TREATMENT NOTE   Patient Name: Krista Harrison MRN: [id number] DOB:Sep 21, 1961, 61 y.o., female Today's Date: 05/30/2022  PCP: Juline Patch, MD   REFERRING PROVIDER: Lennice Sites, MD    END OF SESSION:   PT End of Session - 05/30/22 1623     Visit Number 5    Number of Visits 7    Date for PT Re-Evaluation 06/03/22    Authorization Type BCBS    PT Start Time 1625   patient late   PT Stop Time 1659    PT Time Calculation (min) 34 min    Activity Tolerance Patient tolerated treatment well    Behavior During Therapy WFL for tasks assessed/performed               Past Medical History:  Diagnosis Date   Anxiety    Edema of both lower extremities    Factor V Leiden ([place])    heterozygous   GERD (gastroesophageal reflux disease)    Hypertension    Large breasts    Obesity    Past Surgical History:  Procedure Laterality Date   ABDOMINAL HYSTERECTOMY  2009   ROTATOR CUFF REPAIR Bilateral 2005, 2006   Patient Active Problem List   Diagnosis Date Noted   Morbid obesity ([place]) 10/28/2020   Prediabetes 10/26/2020   Vitamin D deficiency 10/26/2020   Pure hypercholesterolemia 04/04/2018   Rash, skin 04/04/2018   BRCA gene mutation negative 03/21/2017   Essential hypertension 09/25/2016   Gastroesophageal reflux disease 09/25/2016   Acute anxiety 09/25/2016    REFERRING DIAG: Neck, back and shoulder pain due to mammary hypertrophy   THERAPY DIAG:  Cervicalgia  Other low back pain  Muscle weakness (generalized)  Abnormal posture  Rationale for Evaluation and Treatment Rehabilitation  PERTINENT HISTORY: History of bilateral Rotator cuff repair 2004 and 2005  PRECAUTIONS: None  SUBJECTIVE: Patient reports she is feeling good today. She reports compliance with HEP.   PAIN:  Are you having pain? None currently:  NPRS scale: 0/10 currently; at worst: 5/10  Pain location: Neck and back Pain description:   tension Aggravating factors: Sitting extended periods, general activity Relieving factors: Rest  PATIENT GOALS "I need to get my body strong to accommodate a surgery."   OBJECTIVE: (objective measures completed at initial evaluation unless otherwise dated) POSTURE:  Rounded shoulders and forward head; slump sitting posture    PALPATION: TTP bilateral upper traps and lumbar paraspinals            UE/LE MMT:   MMT Right eval Left eval 05/24/22 05/30/22  Hip flexion 3+ 3+ 4+/5 bilaterally   Hip extension 4 4    Hip abduction 4 4  Lt: 4+/5 bilaterally  Shoulder flexion  5 5    Shoulder abduction 5 5    Shoulder internal rotation 5 5    Shoulder external rotation 5 5    Middle trapezius 4 4    Lower trapezius 3+ 3+        TODAY'S TREATMENT:  [facility] Adult PT Treatment:                                                DATE: 05/30/22 Therapeutic Exercise: Modified 100s 3 x 30  Standing resisted shoulder horizontal abduction and diagonals 3 x 10; yellow band  Prone row 2 x 15; 6  lbs  Supine lat pull 6 lbs 2 x 15  Serratus punch 2 x 15; 6 lbs  Bilateral shoulder ER green band 3 x 10  Wall push-ups 2 x 10  Wall walks 3 sets d/b x 10 ft yellow band Scapular clocks at wall 1 x 5 each with red med ball   [facility] Adult PT Treatment:                                                DATE: 05/24/22 Therapeutic Exercise: UBE level 3 x 2 min each fwd/bwd Dead bug 3 x 20  Sideplank 2 x 20 sec  KB swings 2 x 10; 10 lbs  Lateral kettlebell lift 2 x 15; 10 lbs  Leg press 1 x 10 @ 40 lbs 1 x 10 60 lbs  Deadlift 5 lb kettlebell 2 x 10  Updated HEP  [facility] Adult PT Treatment:                                                DATE: 05/02/22 Therapeutic Exercise: UBE level 3, 2 min each fwd/bwd  Supine pelvic tilts 2 x 10  Figure 4 stretch x 60 sec each  Supine TA march 2 x 10  Hip bridge with abduction blue band 2 x 10  Sidelying hip abduction 2 x 15  Serratus wall slides 2 x 15  Standing resisted  horizontal abduction red band 2 x 15 Quadruped single arm reach 2 x 10   Push up at counter 2 x 10  Resisted rows 45 lbs 3 x 10  Sidelying hip circles CW/CCW 2 x 10 bilateral  Diaphragmatic breathing x 10  Updated HEP    PATIENT EDUCATION:  Education details: N/A Person educated: N/A Education method: N/A Education comprehension: N/A  HOME EXERCISE PROGRAM: Access Code: [email or web address]    ASSESSMENT: CLINICAL IMPRESSION: Session was limited as patient was late for scheduled visit. She tolerated session well today with focus on progression of core and postural strengthening. She requires minimal postural cues throughout session and denies any pain with ther ex.      OBJECTIVE IMPAIRMENTS decreased strength, increased fascial restrictions, postural dysfunction, and pain.    ACTIVITY LIMITATIONS  able to tolerate all activities at this time   PARTICIPATION LIMITATIONS:  recreation   PERSONAL FACTORS Age, Fitness, Profession, and Time since onset of injury/illness/exacerbation are also affecting patient's functional outcome.      GOALS: Goals reviewed with patient? No    LONG TERM GOALS: Target date: 06/02/2022   Patient will demonstrate 5/5 bilateral hip strength to improve overall lumbopelvic stability.  Baseline: see above  Goal status: INITIAL   2.  Patient will demonstrate knowledge and application of appropriate sitting posture to reduce stress on her neck and back.  Baseline: see above Goal status: INITIAL   3.  Patient will be independent with advanced home program to progress/maintain current level of function.  Baseline: initial HEP issued  Goal status: INITIAL   4.  Patient will demonstrate 5/5 bilateral middle trap strength to improve scapular stability.  Baseline: see above  Goal status: INITIAL     PLAN: PT FREQUENCY: 1x/week   PT DURATION: 6 weeks   PLANNED INTERVENTIONS: Therapeutic  exercises, Therapeutic activity, Neuromuscular re-education,  Patient/Family education, Dry Needling, Electrical stimulation, Cryotherapy, Moist heat, Manual therapy, and Re-evaluation   PLAN FOR NEXT SESSION: review HEP and progress PRN, TPDN to upper traps, posture education, general core and periscapular strengthening  Gwendolyn Grant, PT, DPT, ATC 05/30/22 5:01 PM

## 2022-05-30 NOTE — Patient Instructions (Signed)
The 10-year ASCVD risk score (Arnett DK, et al., 2019) is: 4.1%   Values used to calculate the score:     Age: 61 years     Sex: Female     Is Non-Hispanic African American: No     Diabetic: No     Tobacco smoker: No     Systolic Blood Pressure: 959 mmHg     Is BP treated: Yes     HDL Cholesterol: 62 mg/dL     Total Cholesterol: 257 mg/dL

## 2022-05-31 ENCOUNTER — Other Ambulatory Visit: Payer: Self-pay | Admitting: Family Medicine

## 2022-05-31 DIAGNOSIS — I10 Essential (primary) hypertension: Secondary | ICD-10-CM

## 2022-05-31 NOTE — Telephone Encounter (Signed)
Pt has a future OV scheduled, will refill medication.  Requested Prescriptions  Pending Prescriptions Disp Refills  . lisinopril-hydrochlorothiazide (ZESTORETIC) 10-12.5 MG tablet [Pharmacy Med Name: LISINOPRIL-HCTZ 10-12.5 MG TAB] 90 tablet 0    Sig: TAKE 1 TABLET BY MOUTH EVERY DAY     Cardiovascular:  ACEI + Diuretic Combos Failed - 05/31/2022  2:11 AM      Failed - Valid encounter within last 6 months    Recent Outpatient Visits          7 months ago Essential hypertension   [facility] [place], Deanna C, MD   1 year ago Essential hypertension   [facility] [place], Deanna C, MD   2 years ago Annual physical exam   [facility] [place], Deanna C, MD   2 years ago Essential hypertension   [facility] [place], Deanna C, MD   3 years ago Cough   [facility] [place], [place], MD      Future Appointments            In 1 week Juline Patch, MD [facility] at [facility], [place]   In 4 months Juline Patch, MD [facility] at [facility], [place] in normal range and within 180 days    Sodium  Date Value Ref Range Status  05/16/2022 143 134 - 144 mmol/L Final         Passed - K in normal range and within 180 days    Potassium  Date Value Ref Range Status  05/16/2022 3.8 3.5 - 5.2 mmol/L Final         Passed - Cr in normal range and within 180 days    Creatinine, Ser  Date Value Ref Range Status  05/16/2022 0.89 0.57 - 1.00 mg/dL Final         Passed - eGFR is 30 or above and within 180 days    GFR calc Af Amer  Date Value Ref Range Status  10/25/2020 95 >59 mL/min/1.73 Final    Comment:    **In accordance with  recommendations from the NKF-ASN Task force,**   [facility] is in the process of updating its eGFR calculation to the   2021 CKD-EPI creatinine equation that estimates kidney function   without a race variable.    GFR, Estimated  Date Value Ref Range Status  05/25/2021 >60 >60 mL/min Final    Comment:    (NOTE) Calculated using the CKD-EPI Creatinine Equation (2021)    eGFR  Date Value Ref Range Status  05/16/2022 74 >59 mL/min/1.73 Final         Passed - Patient is not pregnant      Passed - Last BP in normal range    BP Readings from Last 1 Encounters:  05/30/22 116/83

## 2022-06-03 NOTE — Progress Notes (Signed)
Chief Complaint:   OBESITY Krista Harrison is here to discuss her progress with her obesity treatment plan along with follow-up of her obesity related diagnoses. Krista Harrison is on the Category 3 Plan with breakfast and lunch options and states she is following her eating plan approximately 90% of the time. Krista Harrison states she is doing cardio and weights 40-60 minutes 6 times per week.  Today's visit was #: 2 Starting weight: 263 lbs Starting date: 05/16/2022 Today's weight: 251 lbs Today's date: 05/30/2022 Total lbs lost to date: 12 Total lbs lost since last in-office visit: 12  Interim History: Krista Harrison is here today for her first follow-up office visit since starting the program with [place].  All blood work/ lab tests that were recently ordered by myself or an outside provider were reviewed with patient today per their request.   Extended time was spent counseling her on all new disease processes that were discovered or preexisting ones that are affected by BMI.  she understands that many of these abnormalities will need to monitored regularly along with the current treatment plan of prudent dietary changes, in which we are making each and every office visit, to improve these health parameters. Pt has some hunger when it is time to eat, but otherwise no issues or concerns. She is doing great, but I warned her about too much weight loss. Pt is flying to [place] to see her grandson in the near future.  Subjective:   1. Other hyperlipidemia New. Discussed labs with patient today. Krista Harrison has hyperlipidemia and has been trying to improve her cholesterol levels with intensive lifestyle modifications including a low saturated fat diet, exercise, and weight loss. She denies any chest pain, claudication, or myalgias.  2. Essential hypertension Discussed labs with patient today. Krista Harrison is tolerating medication(s) well without side effects.  Medication compliance is good as patient endorses taking it  as prescribed.  The patient denies additional concerns regarding this condition.      3. Vitamin D deficiency Worsening. Discussed labs with patient today. Krista Harrison is not on daily or weekly supplementation.  4. Prediabetes Discussed labs with patient today. Pt's A1c 7 months ago was 6.5 and consistent with a diagnosis of diabetes mellitus. Her PCP never said she had it. A1c is now 6.1 and fasting insulin has improved from prior level as well.  5. At risk for heart disease Krista Harrison is at risk for heart disease due to hypertension, hyperlipidemia, and prediabetes.  Assessment/Plan:  No orders of the defined types were placed in this encounter.   There are no discontinued medications.   Meds ordered this encounter  Medications   Vitamin D, Ergocalciferol, (DRISDOL) 1.25 MG (50000 UNIT) CAPS capsule    Sig: Take 1 capsule (50,000 Units total) by mouth every 7 (seven) days.    Dispense:  4 capsule    Refill:  0    30 d supply;  ** OV for RF **   Do not send RF request     1. Other hyperlipidemia Cardiovascular risk and specific lipid/LDL goals reviewed.  We discussed several lifestyle modifications today and Krista Harrison will continue to work on diet, exercise and weight loss efforts. Orders and follow up as documented in patient record. Felesha's LDL is 164 and triglycerides 172. Follow prudent nutritional plan with decrease in saturated and trans fats. HDL is great at 62![name]  Counseling Intensive lifestyle modifications are the first line treatment for this issue. Dietary changes: Increase soluble fiber. Decrease simple  carbohydrates. Exercise changes: Moderate to vigorous-intensity aerobic activity 150 minutes per week if tolerated. Lipid-lowering medications: see documented in medical record.  2. Essential hypertension CMP and TSH are within normal limits. Krista Harrison is working on healthy weight loss and exercise to improve blood pressure control. We will watch for signs of hypotension as she  continues her lifestyle modifications. Continue current treatment plan per PCP.  3. Vitamin D deficiency Not at goal. - I discussed the importance of vitamin D to the patient's health and well-being.  - I reviewed possible symptoms of low Vitamin D:  low energy, depressed mood, muscle aches, joint aches, osteoporosis etc. was reviewed with patient - low Vitamin D levels may be linked to an increased risk of cardiovascular events and even increased risk of cancers- such as colon and breast.  - ideal vitamin D levels reviewed with patient  - I recommend pt take a 50,000 IU weekly prescription vit D - see script below   - Informed patient this may be a lifelong thing, and she was encouraged to continue to take the medicine until told otherwise.    - weight loss will likely improve availability of vitamin D, thus encouraged Krista Harrison to continue with meal plan and their weight loss efforts to further improve this condition.  Thus, we will need to monitor levels regularly (every 3-4 mo on average) to keep levels within normal limits and prevent over supplementation. - pt's questions and concerns regarding this condition addressed.  Start- Vitamin D, Ergocalciferol, (DRISDOL) 1.25 MG (50000 UNIT) CAPS capsule; Take 1 capsule (50,000 Units total) by mouth every 7 (seven) days.  Dispense: 4 capsule; Refill: 0  4. Prediabetes Krista Harrison will continue to work on weight loss, exercise, and decreasing simple carbohydrates to help decrease the risk of diabetes.   5. At risk for heart disease - Krista Harrison was given diabetes prevention education and counseling today of more than 30 minutes.  - Counseled patient on pathophysiology of disease and meaning/ implication of lab results.  - Reviewed how certain foods can either stimulate or inhibit insulin release, and subsequently affect hunger pathways  - Importance of following a healthy meal plan with limiting amounts of simple carbohydrates discussed with patient - Effects  of regular aerobic exercise on blood sugar regulation reviewed and encouraged an eventual goal of 30 min 5d/week or more as a minimum.  - Briefly discussed treatment options, which always include dietary and lifestyle modification as first line.   - Handouts provided at patient's desire and/or told to go online to the American Diabetes Association website for further information.  6. Obesity, current BMI 47.4 Krista Harrison is currently in the action stage of change. As such, her goal is to continue with weight loss efforts. She has agreed to the Category 3 Plan with breakfast and lunch options.   Exercise goals:  As is  Behavioral modification strategies: increasing lean protein intake, decreasing simple carbohydrates, and planning for success.  Krista Harrison has agreed to follow-up with our clinic in 3 weeks. She was informed of the importance of frequent follow-up visits to maximize her success with intensive lifestyle modifications for her multiple health conditions.   Objective:   Blood pressure 116/83, pulse 76, temperature 98 F (36.7 C), height '5\' 1"'$  (1.549 m), weight 251 lb (113.9 kg), SpO2 97 %. Body mass index is 47.43 kg/m.  General: Cooperative, alert, well developed, in no acute distress. HEENT: Conjunctivae and lids unremarkable. Cardiovascular: Regular rhythm.  Lungs: Normal work of breathing. Neurologic: No focal  deficits.   Lab Results  Component Value Date   CREATININE 0.89 05/16/2022   BUN 13 05/16/2022   NA 143 05/16/2022   K 3.8 05/16/2022   CL 98 05/16/2022   CO2 23 05/16/2022   Lab Results  Component Value Date   ALT 22 05/16/2022   AST 22 05/16/2022   ALKPHOS 85 05/16/2022   BILITOT 0.3 05/16/2022   Lab Results  Component Value Date   HGBA1C 6.1 (H) 05/16/2022   HGBA1C 6.4 (H) 01/16/2022   HGBA1C 6.5 (H) 10/12/2021   HGBA1C 6.4 (H) 10/25/2020   Lab Results  Component Value Date   INSULIN 12.0 05/16/2022   INSULIN 19.2 10/25/2020   Lab Results  Component  Value Date   TSH 3.440 05/16/2022   Lab Results  Component Value Date   CHOL 257 (H) 05/16/2022   HDL 62 05/16/2022   LDLCALC 164 (H) 05/16/2022   TRIG 171 (H) 05/16/2022   CHOLHDL 4.1 01/16/2022   Lab Results  Component Value Date   VD25OH 27.6 (L) 05/16/2022   VD25OH 27.0 (L) 10/25/2020   Lab Results  Component Value Date   WBC 8.2 05/16/2022   HGB 14.6 05/16/2022   HCT 43.9 05/16/2022   MCV 95 05/16/2022   PLT 294 05/16/2022    Attestation Statements:   Reviewed by clinician on day of visit: allergies, medications, problem list, medical history, surgical history, family history, social history, and previous encounter notes.  I, Kathlene November, BS, CMA, am acting as transcriptionist for [employer], DO.   I have reviewed the above documentation for accuracy and completeness, and I agree with the above. Marjory Sneddon, D.O.  The [facility] [employer] was signed into law in 2016 which includes the topic of electronic health records.  This provides immediate access to information in MyChart.  This includes consultation notes, operative notes, office notes, lab results and pathology reports.  If you have any questions about what you read please let us know at your next visit so we can discuss your concerns and take corrective action if need be.  We are right here with you.

## 2022-06-08 ENCOUNTER — Ambulatory Visit: Payer: BC Managed Care – PPO | Admitting: Physical Therapy

## 2022-06-09 ENCOUNTER — Ambulatory Visit: Payer: BC Managed Care – PPO

## 2022-06-09 ENCOUNTER — Encounter: Payer: BC Managed Care – PPO | Admitting: Physical Therapy

## 2022-06-09 DIAGNOSIS — M542 Cervicalgia: Secondary | ICD-10-CM

## 2022-06-09 DIAGNOSIS — M5459 Other low back pain: Secondary | ICD-10-CM

## 2022-06-09 DIAGNOSIS — R293 Abnormal posture: Secondary | ICD-10-CM

## 2022-06-09 DIAGNOSIS — M6281 Muscle weakness (generalized): Secondary | ICD-10-CM | POA: Diagnosis not present

## 2022-06-09 NOTE — Therapy (Signed)
OUTPATIENT PHYSICAL THERAPY TREATMENT NOTE/RE-CERTIFICATION PHYSICAL THERAPY DISCHARGE SUMMARY  Visits from Start of Care: 6  Current functional level related to goals / functional outcomes: Goals met   Remaining deficits: Neck and back tension    Education / Equipment: See treatment    Patient agrees to discharge. Patient goals were met. Patient is being discharged due to meeting the stated rehab goals.   Patient Name: Krista Harrison MRN: [id number] DOB:[birth year], 61 y.o., female Today's Date: 06/09/2022  PCP: Juline Patch, MD   REFERRING PROVIDER: Lennice Sites, MD    END OF SESSION:   PT End of Session - 06/09/22 0931     Visit Number 6    Number of Visits 7    Date for PT Re-Evaluation --   N/A D/C   Authorization Type BCBS    PT Start Time 0930    PT Stop Time 0956    PT Time Calculation (min) 26 min    Activity Tolerance Patient tolerated treatment well    Behavior During Therapy WFL for tasks assessed/performed                Past Medical History:  Diagnosis Date   Anxiety    Edema of both lower extremities    Factor V Leiden ([place])    heterozygous   GERD (gastroesophageal reflux disease)    Hypertension    Large breasts    Obesity    Past Surgical History:  Procedure Laterality Date   ABDOMINAL HYSTERECTOMY  2009   ROTATOR CUFF REPAIR Bilateral 2005, 2006   Patient Active Problem List   Diagnosis Date Noted   Morbid obesity ([place]) 10/28/2020   Prediabetes 10/26/2020   Vitamin D deficiency 10/26/2020   Pure hypercholesterolemia 04/04/2018   Rash, skin 04/04/2018   BRCA gene mutation negative 03/21/2017   Essential hypertension 09/25/2016   Gastroesophageal reflux disease 09/25/2016   Acute anxiety 09/25/2016    REFERRING DIAG: Neck, back and shoulder pain due to mammary hypertrophy   THERAPY DIAG:  Cervicalgia  Other low back pain  Muscle weakness (generalized)  Abnormal posture  Rationale for Evaluation and  Treatment Rehabilitation  PERTINENT HISTORY: History of bilateral Rotator cuff repair 2004 and 2005  PRECAUTIONS: None  SUBJECTIVE: Patient reports she is feeling ok, but still experiences neck and back tension that hasn't changed since starting PT that she attributes to her breast size.   PAIN:  Are you having pain? None currently:  NPRS scale: 0/10 currently; at worst: 5/10  Pain location: Neck and back Pain description:  tension Aggravating factors: Sitting extended periods, general activity Relieving factors: Rest  PATIENT GOALS "I need to get my body strong to accommodate a surgery."   OBJECTIVE: (objective measures completed at initial evaluation unless otherwise dated) POSTURE:  Rounded shoulders and forward head; slump sitting posture   06/09/22: WNL   PALPATION: TTP bilateral upper traps and lumbar paraspinals            UE/LE MMT:   MMT Right eval Left eval 05/24/22 05/30/22 06/09/22  Hip flexion 3+ 3+ 4+/5 bilaterally  5/5 bilaterally  Hip extension 4 4   5/5 bilaterally   Hip abduction 4 4  Lt: 4+/5 bilaterally 5/5 bilaterally  Shoulder flexion  5 5     Shoulder abduction 5 5     Shoulder internal rotation 5 5     Shoulder external rotation 5 5     Middle trapezius 4 4   5/5 bilaterally  Lower  trapezius 3+ 3+   4/5 bilaterally       TODAY'S TREATMENT:  [facility] Adult PT Treatment:                                                DATE: 06/09/22 Therapeutic Exercise: Reviewed and updated HEP discussing frequency and duration as well as progression of strengthening exercises she can complete as part of her gym routine.   Therapeutic Activity: Re-assessment to determine overall progress, educating patient on progress towards goals.    [facility] - Inpatient Adult PT Treatment:                                                DATE: 05/30/22 Therapeutic Exercise: Modified 100s 3 x 30  Standing resisted shoulder horizontal abduction and diagonals 3 x 10; yellow band  Prone row 2 x 15; 6  lbs  Supine lat pull 6 lbs 2 x 15  Serratus punch 2 x 15; 6 lbs  Bilateral shoulder ER green band 3 x 10  Wall push-ups 2 x 10  Wall walks 3 sets d/b x 10 ft yellow band Scapular clocks at wall 1 x 5 each with red med ball   [facility] Adult PT Treatment:                                                DATE: 05/24/22 Therapeutic Exercise: UBE level 3 x 2 min each fwd/bwd Dead bug 3 x 20  Sideplank 2 x 20 sec  KB swings 2 x 10; 10 lbs  Lateral kettlebell lift 2 x 15; 10 lbs  Leg press 1 x 10 @ 40 lbs 1 x 10 60 lbs  Deadlift 5 lb kettlebell 2 x 10  Updated HEP     PATIENT EDUCATION:  Education details: see treatment; D/C education  Person educated: patient Education method: demo, handout Education comprehension: verbalized understanding   HOME EXERCISE PROGRAM: Access Code: [email or web address]    ASSESSMENT: CLINICAL IMPRESSION: Patient has attended 6 PT sessions demonstrating overall improvements in hip, core, and periscapular strength and postural awareness since the start of care. She has met all functional goals. Despite these objective improvements she continues to have tension and discomfort about her neck and her back that she attributes to her breast size. It was recommended that she f/u with referring provider for further assessment given her ongoing discomfort with patient in agreement with this plan.      OBJECTIVE IMPAIRMENTS decreased strength, increased fascial restrictions, postural dysfunction, and pain.    ACTIVITY LIMITATIONS  able to tolerate all activities at this time   PARTICIPATION LIMITATIONS:  recreation   PERSONAL FACTORS Age, Fitness, Profession, and Time since onset of injury/illness/exacerbation are also affecting patient's functional outcome.      GOALS: Goals reviewed with patient? No    LONG TERM GOALS: Target date: 06/02/2022   Patient will demonstrate 5/5 bilateral hip strength to improve overall lumbopelvic stability.  Baseline: see above  Goal status:  achieved    2.  Patient will demonstrate knowledge and application of appropriate sitting posture to reduce stress on  her neck and back.  Baseline: see above Goal status: achieved    3.  Patient will be independent with advanced home program to progress/maintain current level of function.  Baseline: initial HEP issued  Goal status: achieved    4.  Patient will demonstrate 5/5 bilateral middle trap strength to improve scapular stability.  Baseline: see above  Goal status: achieved      PLAN: PT FREQUENCY: N/A D/C   PT DURATION: N/A D/C   PLANNED INTERVENTIONS: Therapeutic exercises, Therapeutic activity, Neuromuscular re-education, Patient/Family education, Dry Needling, Electrical stimulation, Cryotherapy, Moist heat, Manual therapy, and Re-evaluation   PLAN FOR NEXT SESSION: N/A D/C  Gwendolyn Grant, PT, DPT, ATC 06/09/22 10:01 AM

## 2022-06-12 ENCOUNTER — Ambulatory Visit: Payer: BC Managed Care – PPO | Admitting: Family Medicine

## 2022-06-12 ENCOUNTER — Encounter: Payer: Self-pay | Admitting: Family Medicine

## 2022-06-12 VITALS — BP 120/80 | HR 72 | Ht 61.0 in | Wt 254.0 lb

## 2022-06-12 DIAGNOSIS — F419 Anxiety disorder, unspecified: Secondary | ICD-10-CM | POA: Diagnosis not present

## 2022-06-12 DIAGNOSIS — I10 Essential (primary) hypertension: Secondary | ICD-10-CM

## 2022-06-12 MED ORDER — LISINOPRIL-HYDROCHLOROTHIAZIDE 10-12.5 MG PO TABS: 1.0000 | ORAL_TABLET | Freq: Every day | ORAL | 0 refills | Status: DC

## 2022-06-12 MED ORDER — ALPRAZOLAM 0.25 MG PO TABS: 0.2500 mg | ORAL_TABLET | Freq: Two times a day (BID) | ORAL | 0 refills | Status: DC | PRN

## 2022-06-12 NOTE — Progress Notes (Signed)
Date:  06/12/2022   Name:  Krista Harrison   DOB:  1961-02-07   MRN:  [id number]   Chief Complaint: Hypertension and Anxiety  Hypertension This is a chronic problem. The current episode started more than 1 year ago. The problem has been gradually improving since onset. Associated symptoms include anxiety. Pertinent negatives include no blurred vision, chest pain, headaches, malaise/fatigue, neck pain, orthopnea, palpitations, peripheral edema, PND or shortness of breath. There are no associated agents to hypertension. Past treatments include ACE inhibitors and diuretics. The current treatment provides moderate improvement. There are no compliance problems.  There is no history of angina, kidney disease, CAD/MI, CVA, heart failure, left ventricular hypertrophy, PVD or retinopathy. There is no history of chronic renal disease, a hypertension causing med or renovascular disease.  Anxiety Presents for follow-up visit. Patient reports no chest pain, decreased concentration, depressed mood, dizziness, excessive worry, irritability, nausea, nervous/anxious behavior, palpitations, restlessness, shortness of breath or suicidal ideas.      Lab Results  Component Value Date   NA 143 05/16/2022   K 3.8 05/16/2022   CO2 23 05/16/2022   GLUCOSE 101 (H) 05/16/2022   BUN 13 05/16/2022   CREATININE 0.89 05/16/2022   CALCIUM 9.5 05/16/2022   EGFR 74 05/16/2022   GFRNONAA >60 05/25/2021   Lab Results  Component Value Date   CHOL 257 (H) 05/16/2022   HDL 62 05/16/2022   LDLCALC 164 (H) 05/16/2022   TRIG 171 (H) 05/16/2022   CHOLHDL 4.1 01/16/2022   Lab Results  Component Value Date   TSH 3.440 05/16/2022   Lab Results  Component Value Date   HGBA1C 6.1 (H) 05/16/2022   Lab Results  Component Value Date   WBC 8.2 05/16/2022   HGB 14.6 05/16/2022   HCT 43.9 05/16/2022   MCV 95 05/16/2022   PLT 294 05/16/2022   Lab Results  Component Value Date   ALT 22 05/16/2022   AST 22  05/16/2022   ALKPHOS 85 05/16/2022   BILITOT 0.3 05/16/2022   Lab Results  Component Value Date   VD25OH 27.6 (L) 05/16/2022     Review of Systems  Constitutional:  Negative for chills, fever, irritability and malaise/fatigue.  HENT:  Negative for drooling, ear discharge, ear pain and sore throat.   Eyes:  Negative for blurred vision.  Respiratory:  Negative for cough, shortness of breath and wheezing.   Cardiovascular:  Negative for chest pain, palpitations, orthopnea, leg swelling and PND.  Gastrointestinal:  Negative for abdominal pain, blood in stool, constipation, diarrhea and nausea.  Endocrine: Negative for polydipsia.  Genitourinary:  Negative for dysuria, frequency, hematuria and urgency.  Musculoskeletal:  Negative for back pain, myalgias and neck pain.  Skin:  Negative for rash.  Allergic/Immunologic: Negative for environmental allergies.  Neurological:  Negative for dizziness and headaches.  Hematological:  Does not bruise/bleed easily.  Psychiatric/Behavioral:  Negative for decreased concentration and suicidal ideas. The patient is not nervous/anxious.     Patient Active Problem List   Diagnosis Date Noted   Morbid obesity ([place]) 10/28/2020   Prediabetes 10/26/2020   Vitamin D deficiency 10/26/2020   Pure hypercholesterolemia 04/04/2018   Rash, skin 04/04/2018   BRCA gene mutation negative 03/21/2017   Essential hypertension 09/25/2016   Gastroesophageal reflux disease 09/25/2016   Acute anxiety 09/25/2016    No Known Allergies  Past Surgical History:  Procedure Laterality Date   ABDOMINAL HYSTERECTOMY  2009   ROTATOR CUFF REPAIR Bilateral 2005, 2006  Social History   Tobacco Use   Smoking status: Never   Smokeless tobacco: Never  Vaping Use   Vaping Use: Never used  Substance Use Topics   Alcohol use: Yes    Alcohol/week: 0.0 standard drinks of alcohol    Comment: socially   Drug use: No     Medication list has been reviewed and  updated.  Current Meds  Medication Sig   esomeprazole (NEXIUM) 20 MG capsule Take by mouth.   lisinopril-hydrochlorothiazide (ZESTORETIC) 10-12.5 MG tablet TAKE 1 TABLET BY MOUTH EVERY DAY   Vitamin D, Ergocalciferol, (DRISDOL) 1.25 MG (50000 UNIT) CAPS capsule Take 1 capsule (50,000 Units total) by mouth every 7 (seven) days.       06/12/2022    3:08 PM 10/12/2021    8:05 AM 12/17/2020    8:22 AM 05/31/2020    8:45 AM  GAD 7 : Generalized Anxiety Score  Nervous, Anxious, on Edge 0 0 2 1  Control/stop worrying 0 0 1 1  Worry too much - different things 1 0 0 1  Trouble relaxing 1 0 0 0  Restless 0 0 0 0  Easily annoyed or irritable 0 0 0 0  Afraid - awful might happen 0 0 0 0  Total GAD 7 Score 2 0 3 3  Anxiety Difficulty Not difficult at all Not difficult at all Not difficult at all Not difficult at all       06/12/2022    3:08 PM 05/16/2022    9:12 AM 10/12/2021    8:04 AM  Depression screen PHQ 2/9  Decreased Interest 0 2 0  Down, Depressed, Hopeless 0 0 0  PHQ - 2 Score 0 2 0  Altered sleeping 0 1 0  Tired, decreased energy 0 1 2  Change in appetite 0 1 0  Feeling bad or failure about yourself  0 0 0  Trouble concentrating 0 0 0  Moving slowly or fidgety/restless 0 1 0  Suicidal thoughts 0 0 0  PHQ-9 Score 0 6 2  Difficult doing work/chores Not difficult at all Not difficult at all Not difficult at all    BP Readings from Last 3 Encounters:  06/12/22 120/80  05/30/22 116/83  05/16/22 (!) 140/87    Physical Exam Vitals and nursing note reviewed. Exam conducted with a chaperone present.  Constitutional:      General: She is not in acute distress.    Appearance: She is not diaphoretic.  HENT:     Head: Normocephalic and atraumatic.     Right Ear: Tympanic membrane and external ear normal.     Left Ear: Tympanic membrane and external ear normal.     Nose: Nose normal.     Mouth/Throat:     Mouth: Mucous membranes are moist.  Eyes:     General:        Right  eye: No discharge.        Left eye: No discharge.     Conjunctiva/sclera: Conjunctivae normal.     Pupils: Pupils are equal, round, and reactive to light.  Neck:     Thyroid: No thyromegaly.     Vascular: No JVD.  Cardiovascular:     Rate and Rhythm: Normal rate and regular rhythm.     Heart sounds: Normal heart sounds. No murmur heard.    No friction rub. No gallop.  Pulmonary:     Effort: Pulmonary effort is normal.     Breath sounds: Normal breath sounds. No wheezing,  rhonchi or rales.  Chest:     Chest wall: No tenderness.  Abdominal:     General: Bowel sounds are normal.     Palpations: Abdomen is soft. There is no mass.     Tenderness: There is no abdominal tenderness. There is no guarding or rebound.  Musculoskeletal:        General: Normal range of motion.     Cervical back: Normal range of motion and neck supple.  Lymphadenopathy:     Cervical: No cervical adenopathy.  Skin:    General: Skin is warm and dry.  Neurological:     Mental Status: She is alert.     Deep Tendon Reflexes: Reflexes are normal and symmetric.     Wt Readings from Last 3 Encounters:  06/12/22 254 lb (115.2 kg)  05/30/22 251 lb (113.9 kg)  05/16/22 263 lb (119.3 kg)    BP 120/80   Pulse 72   Ht '5\' 1"'  (1.549 m)   Wt 254 lb (115.2 kg)   BMI 47.99 kg/m   Assessment and Plan: 1. Essential hypertension Controlled.  Stable.  Blood pressure stable.  Blood pressure today is stable 120/80.  Tolerating medication well and will continue on  - lisinopril-hydrochlorothiazide (ZESTORETIC) 10-12.5 MG tablet; Take 1 tablet by mouth daily.  Dispense: 90 tablet; Refill: 0  2. Anxiety Patient is doing well (Apresoline 0.25 mg.  For roughly refill of this to be taken on as-needed basis for extreme circumstances PHQ was noted.  0 and gad score is 2 - ALPRAZolam (XANAX) 0.25 MG tablet; Take 1 tablet (0.25 mg total) by mouth 2 (two) times daily as needed for anxiety.  Dispense: 20 tablet; Refill: 0      Otilio Miu, MD

## 2022-06-13 ENCOUNTER — Encounter: Payer: BC Managed Care – PPO | Admitting: Physical Therapy

## 2022-06-21 ENCOUNTER — Ambulatory Visit (INDEPENDENT_AMBULATORY_CARE_PROVIDER_SITE_OTHER): Payer: BC Managed Care – PPO | Admitting: Family Medicine

## 2022-06-21 ENCOUNTER — Encounter (INDEPENDENT_AMBULATORY_CARE_PROVIDER_SITE_OTHER): Payer: Self-pay | Admitting: Family Medicine

## 2022-06-21 VITALS — BP 108/77 | Temp 98.5°F | Ht 61.0 in | Wt 245.0 lb

## 2022-06-21 DIAGNOSIS — E559 Vitamin D deficiency, unspecified: Secondary | ICD-10-CM | POA: Diagnosis not present

## 2022-06-21 DIAGNOSIS — Z6841 Body Mass Index (BMI) 40.0 and over, adult: Secondary | ICD-10-CM | POA: Diagnosis not present

## 2022-06-21 DIAGNOSIS — E669 Obesity, unspecified: Secondary | ICD-10-CM | POA: Diagnosis not present

## 2022-06-21 DIAGNOSIS — Z9189 Other specified personal risk factors, not elsewhere classified: Secondary | ICD-10-CM

## 2022-06-21 DIAGNOSIS — E662 Morbid (severe) obesity with alveolar hypoventilation: Secondary | ICD-10-CM

## 2022-06-21 MED ORDER — VITAMIN D (ERGOCALCIFEROL) 1.25 MG (50000 UNIT) PO CAPS: 50000.0000 [IU] | ORAL_CAPSULE | ORAL | 0 refills | Status: DC

## 2022-06-28 NOTE — Progress Notes (Signed)
Chief Complaint:   OBESITY Krista Harrison is here to discuss her progress with her obesity treatment plan along with follow-up of her obesity related diagnoses. Krista Harrison is on the Category 3 Plan with breakfast and lunch options and states she is following her eating plan approximately 70% of the time. Krista Harrison states she is doing cardio and weights 30-45 minutes 2-4 times per week.  Today's visit was #: 3 Starting weight: 263 lbs Starting date: 05/16/2022 Today's weight: 245 lbs Today's date: 06/21/2022 Total lbs lost to date: 18 Total lbs lost since last in-office visit: 6  Interim History: Krista Harrison visited with her family and ate off plan. She lost 12 lbs in fat mass and gained over 6 lbs in muscle mass. Pt works with a Physiological scientist 2 days a week and works out at [employer] 4 days a week. She snacks on almonds occasionally and watermelon. Lunch is 7 oz of tuna over baby spinach with tomato and crackers.  Subjective:   1. Vitamin D deficiency She is currently taking prescription vitamin D 50,000 IU each week. She denies nausea, vomiting or muscle weakness.  2. At risk for dehydration Krista Harrison is at risk for dehydration due to  inadequate water intake.  Assessment/Plan:  No orders of the defined types were placed in this encounter.   Medications Discontinued During This Encounter  Medication Reason   Vitamin D, Ergocalciferol, (DRISDOL) 1.25 MG (50000 UNIT) CAPS capsule Reorder     Meds ordered this encounter  Medications   Vitamin D, Ergocalciferol, (DRISDOL) 1.25 MG (50000 UNIT) CAPS capsule    Sig: Take 1 capsule (50,000 Units total) by mouth every 7 (seven) days.    Dispense:  4 capsule    Refill:  0    30 d supply;  ** OV for RF **   Do not send RF request     1. Vitamin D deficiency Low Vitamin D level contributes to fatigue and are associated with obesity, breast, and colon cancer. She agrees to continue to take prescription Vitamin D '@50'$ ,000 IU every week and will follow-up for  routine testing of Vitamin D, at least 2-3 times per year to avoid over-replacement.  Refill- Vitamin D, Ergocalciferol, (DRISDOL) 1.25 MG (50000 UNIT) CAPS capsule; Take 1 capsule (50,000 Units total) by mouth every 7 (seven) days.  Dispense: 4 capsule; Refill: 0  2. At risk for dehydration Krista Harrison is at higher than average risk of dehydration.  Krista Harrison was given more than 15 minutes of proper hydration counseling today.  We discussed the signs and symptoms of dehydration, some of which may include muscle cramping, constipation or even orthostatic symptoms.  Counseling on the prevention of dehydration was also provided today.  Krista Harrison is at risk for dehydration due to weight loss, lifestyle and behavorial habits and possibly due to taking certain medication(s).  She was encouraged to adequately hydrate and monitor fluid status to avoid dehydration as well as weight loss plateaus.  Unless pre-existing renal or cardiopulmonary conditions exist, in which patient was told to limit their fluid intake, I recommended roughly one half of their weight in pounds to be the approximate ounces of non-caloric, non-caffeinated beverages they should drink per day; including more if they are engaging in exercise.  Krista Harrison is at higher than average risk of dehydration.  Krista Harrison was given more than 9 minutes of proper hydration counseling today.  We discussed the signs and symptoms of dehydration, some of which may include muscle cramping, constipation, or even orthostatic  symptoms.  Counseling on the prevention of dehydration was also provided today.  Krista Harrison is at risk for dehydration due to weight loss, lifestyle and behavorial habits, and possibly due to taking certain medication(s).  She was encouraged to adequately hydrate and monitor fluid status to avoid dehydration as well as weight loss plateaus.  Unless pre-existing renal or cardiopulmonary conditions exist, which pt was told to limit their fluid intake.  I recommended roughly  one half of their weight in pounds to be the approximate ounces of non-caloric, non-caffeinated beverages they should drink per day; including more if they are engaging in exercise.  3. Obesity, current BMI 46.4 Krista Harrison is currently in the action stage of change. As such, her goal is to continue with weight loss efforts. She has agreed to the Category 3 Plan with breakfast and lunch options.   DON'T WEIGH AT HOME OR WORK!  Exercise goals:  As is  Behavioral modification strategies: increasing lean protein intake and planning for success.  Krista Harrison has agreed to follow-up with our clinic in 3 weeks. She was informed of the importance of frequent follow-up visits to maximize her success with intensive lifestyle modifications for her multiple health conditions.   Objective:   Blood pressure 108/77, temperature 98.5 F (36.9 C), height '5\' 1"'$  (1.549 m), weight 245 lb (111.1 kg). Body mass index is 46.29 kg/m.  General: Cooperative, alert, well developed, in no acute distress. HEENT: Conjunctivae and lids unremarkable. Cardiovascular: Regular rhythm.  Lungs: Normal work of breathing. Neurologic: No focal deficits.   Lab Results  Component Value Date   CREATININE 0.89 05/16/2022   BUN 13 05/16/2022   NA 143 05/16/2022   K 3.8 05/16/2022   CL 98 05/16/2022   CO2 23 05/16/2022   Lab Results  Component Value Date   ALT 22 05/16/2022   AST 22 05/16/2022   ALKPHOS 85 05/16/2022   BILITOT 0.3 05/16/2022   Lab Results  Component Value Date   HGBA1C 6.1 (H) 05/16/2022   HGBA1C 6.4 (H) 01/16/2022   HGBA1C 6.5 (H) 10/12/2021   HGBA1C 6.4 (H) 10/25/2020   Lab Results  Component Value Date   INSULIN 12.0 05/16/2022   INSULIN 19.2 10/25/2020   Lab Results  Component Value Date   TSH 3.440 05/16/2022   Lab Results  Component Value Date   CHOL 257 (H) 05/16/2022   HDL 62 05/16/2022   LDLCALC 164 (H) 05/16/2022   TRIG 171 (H) 05/16/2022   CHOLHDL 4.1 01/16/2022   Lab Results   Component Value Date   VD25OH 27.6 (L) 05/16/2022   VD25OH 27.0 (L) 10/25/2020   Lab Results  Component Value Date   WBC 8.2 05/16/2022   HGB 14.6 05/16/2022   HCT 43.9 05/16/2022   MCV 95 05/16/2022   PLT 294 05/16/2022    Attestation Statements:   Reviewed by clinician on day of visit: allergies, medications, problem list, medical history, surgical history, family history, social history, and previous encounter notes.  I, Kathlene November, BS, CMA, am acting as transcriptionist for [employer], DO.   I have reviewed the above documentation for accuracy and completeness, and I agree with the above. Marjory Sneddon, D.O.  The [place] was signed into law in 2016 which includes the topic of electronic health records.  This provides immediate access to information in MyChart.  This includes consultation notes, operative notes, office notes, lab results and pathology reports.  If you have any questions about what you read  please let us know at your next visit so we can discuss your concerns and take corrective action if need be.  We are right here with you.

## 2022-06-29 ENCOUNTER — Ambulatory Visit: Payer: BC Managed Care – PPO | Admitting: Surgical

## 2022-06-29 ENCOUNTER — Encounter: Payer: Self-pay | Admitting: Surgical

## 2022-06-29 VITALS — Wt 249.8 lb

## 2022-06-29 DIAGNOSIS — N62 Hypertrophy of breast: Secondary | ICD-10-CM

## 2022-06-29 DIAGNOSIS — M542 Cervicalgia: Secondary | ICD-10-CM | POA: Diagnosis not present

## 2022-06-29 DIAGNOSIS — M25511 Pain in right shoulder: Secondary | ICD-10-CM | POA: Diagnosis not present

## 2022-06-29 DIAGNOSIS — M25512 Pain in left shoulder: Secondary | ICD-10-CM

## 2022-06-29 DIAGNOSIS — M549 Dorsalgia, unspecified: Secondary | ICD-10-CM

## 2022-06-29 DIAGNOSIS — R21 Rash and other nonspecific skin eruption: Secondary | ICD-10-CM

## 2022-06-29 NOTE — Progress Notes (Signed)
   Referring Provider Krista Patch, MD [address] [place],  [place] [address]   CC:  Chief Complaint  Patient presents with   Follow-up      Krista Harrison is an 61 y.o. female.  HPI: Patient is a 61 y.o. year old female here for follow up after completing physical therapy for pain related to macromastia. She reports she has completed 6 visits of physical therapy.  Initial evaluation was on 04/21/2022 and most recent PT treatment was on 06/09/2022.  She reports that she is still having neck and shoulder pain, also reports back pain.  She reports that she has also been evaluated at healthy weight and wellness at [place], she has also been exercising at [employer] 5 days a week as well as seeing a personal trainer 2 days/week.  She has been eating much healthier and has lost some weight since her initial appointment.  Her initial weight on 03/31/2022 was 262 pounds and today she is 249 pounds.    She reports that she is still interested in pursuing surgical intervention. She reports that she is a G cup.  Review of Systems MSK: Positive back and neck/shoulder pain  Physical Exam    06/29/2022   10:49 AM 06/21/2022    3:00 PM 06/12/2022    3:02 PM  Vitals with BMI  Height  '5\' 1"'$  '5\' 1"'$   Weight 249 lbs 13 oz 245 lbs 254 lbs  BMI 47.22 97.02 63.78  Systolic  588 502  Diastolic  77 80  Pulse   72    General:  No acute distress,  Alert and oriented, Non-Toxic, Normal speech and affect Psych: Normal behavior and mood   Assessment/Plan Patient is interested in pursuing surgical intervention for bilateral breast reduction. Patient has completed at least 6 weeks of physical therapy for pain related to macromastia.  She has also started healthy weight and wellness, reports that this did take a few months to start and is only recently started due to a wait list of about 2 months.  Discussed with patient we would submit to insurance for authorization, discussed approval  could take up to 6 weeks.   Pictures were obtained of the patient and placed in the chart with the patient's or guardian's permission.   Krista Harrison Krista Harrison 06/29/2022, 10:53 AM

## 2022-06-30 ENCOUNTER — Encounter: Payer: Self-pay | Admitting: Plastic Surgery

## 2022-07-05 ENCOUNTER — Ambulatory Visit (INDEPENDENT_AMBULATORY_CARE_PROVIDER_SITE_OTHER): Payer: BC Managed Care – PPO | Admitting: Family Medicine

## 2022-07-13 ENCOUNTER — Ambulatory Visit (INDEPENDENT_AMBULATORY_CARE_PROVIDER_SITE_OTHER): Payer: BC Managed Care – PPO | Admitting: Family Medicine

## 2022-07-13 ENCOUNTER — Encounter (INDEPENDENT_AMBULATORY_CARE_PROVIDER_SITE_OTHER): Payer: Self-pay | Admitting: Family Medicine

## 2022-07-13 VITALS — BP 117/78 | HR 86 | Temp 98.5°F | Ht 61.0 in | Wt 240.0 lb

## 2022-07-13 DIAGNOSIS — E559 Vitamin D deficiency, unspecified: Secondary | ICD-10-CM

## 2022-07-13 DIAGNOSIS — E669 Obesity, unspecified: Secondary | ICD-10-CM

## 2022-07-13 DIAGNOSIS — Z9189 Other specified personal risk factors, not elsewhere classified: Secondary | ICD-10-CM | POA: Insufficient documentation

## 2022-07-13 DIAGNOSIS — Z6841 Body Mass Index (BMI) 40.0 and over, adult: Secondary | ICD-10-CM | POA: Diagnosis not present

## 2022-07-13 DIAGNOSIS — E662 Morbid (severe) obesity with alveolar hypoventilation: Secondary | ICD-10-CM

## 2022-07-20 NOTE — Progress Notes (Signed)
Chief Complaint:   OBESITY Krista Harrison is here to discuss her progress with her obesity treatment plan along with follow-up of her obesity related diagnoses. Krista Harrison is on the Category 3 Plan with breakfast and lunch options and states she is following her eating plan approximately 90% of the time. Krista Harrison states she is working with a Clinical research associate at [employer] 30 minutes 2-5 times per week.  Today's visit was #: 4 Starting weight: 263 lbs Starting date: 05/16/2022 Today's weight: 240 lbs Today's date: 07/13/2022 Total lbs lost to date: 23 Total lbs lost since last in-office visit: 5  Interim History: "I am so thankful I don't hurt my joints and muscles. I have so much more energy." Pt misses pasta and is wondering about options.  Subjective:   1. Vitamin D deficiency Krista Harrison reports GERD symptoms an hour and a half after she takes Ergocalciferol and has to take TUMS because of it.  2. At risk for impaired metabolic function Krista Harrison is at increased risk for impaired metabolic function due to current nutrition and muscle mass.  Assessment/Plan:  No orders of the defined types were placed in this encounter.   There are no discontinued medications.   No orders of the defined types were placed in this encounter.    1. Vitamin D deficiency Low Vitamin D level contributes to fatigue and are associated with obesity, breast, and colon cancer. She agrees to continue to take OTC Vitamin D 10,000 IU daily and will follow-up for routine testing of Vitamin D, at least 2-3 times per year to avoid over-replacement.  2. At risk for impaired metabolic function Due to Krista Harrison's current state of health and medical condition(s), she is at a significantly higher risk for impaired metabolic function.   At least 9 minutes was spent on counseling Krista Harrison about these concerns today.  This places the patient at a much greater risk to subsequently develop cardio-pulmonary conditions that can negatively affect the patient's  quality of life.  I stressed the importance of reversing these risks factors.  The initial goal is to lose at least 5-10% of starting weight to help reduce risk factors.  Counseling:  Intensive lifestyle modifications discussed with Krista Harrison as the most appropriate first line treatment.  she will continue to work on diet, exercise, and weight loss efforts.  We will continue to reassess these conditions on a fairly regular basis in an attempt to decrease the patient's overall morbidity and mortality.  3. Obesity, current BMI 45.5 Krista Harrison is currently in the action stage of change. As such, her goal is to continue with weight loss efforts. She has agreed to the Category 3 Plan with breakfast and lunch options.   Discussed with pt healthy pasta options and alternatives. Examples given and meal planning discussed.  Exercise goals:  As is  Behavioral modification strategies: increasing lean protein intake, decreasing simple carbohydrates, decreasing eating out, and planning for success.  Krista Harrison has agreed to follow-up with our clinic in 3-4 weeks. She was informed of the importance of frequent follow-up visits to maximize her success with intensive lifestyle modifications for her multiple health conditions.   Objective:   Blood pressure 117/78, pulse 86, temperature 98.5 F (36.9 C), height '5\' 1"'$  (1.549 m), weight 240 lb (108.9 kg), SpO2 98 %. Body mass index is 45.35 kg/m.  General: Cooperative, alert, well developed, in no acute distress. HEENT: Conjunctivae and lids unremarkable. Cardiovascular: Regular rhythm.  Lungs: Normal work of breathing. Neurologic: No focal deficits.  Lab Results  Component Value Date   CREATININE 0.89 05/16/2022   BUN 13 05/16/2022   NA 143 05/16/2022   K 3.8 05/16/2022   CL 98 05/16/2022   CO2 23 05/16/2022   Lab Results  Component Value Date   ALT 22 05/16/2022   AST 22 05/16/2022   ALKPHOS 85 05/16/2022   BILITOT 0.3 05/16/2022   Lab Results  Component  Value Date   HGBA1C 6.1 (H) 05/16/2022   HGBA1C 6.4 (H) 01/16/2022   HGBA1C 6.5 (H) 10/12/2021   HGBA1C 6.4 (H) 10/25/2020   Lab Results  Component Value Date   INSULIN 12.0 05/16/2022   INSULIN 19.2 10/25/2020   Lab Results  Component Value Date   TSH 3.440 05/16/2022   Lab Results  Component Value Date   CHOL 257 (H) 05/16/2022   HDL 62 05/16/2022   LDLCALC 164 (H) 05/16/2022   TRIG 171 (H) 05/16/2022   CHOLHDL 4.1 01/16/2022   Lab Results  Component Value Date   VD25OH 27.6 (L) 05/16/2022   VD25OH 27.0 (L) 10/25/2020   Lab Results  Component Value Date   WBC 8.2 05/16/2022   HGB 14.6 05/16/2022   HCT 43.9 05/16/2022   MCV 95 05/16/2022   PLT 294 05/16/2022    Attestation Statements:   Reviewed by clinician on day of visit: allergies, medications, problem list, medical history, surgical history, family history, social history, and previous encounter notes.  I, Kathlene November, BS, CMA, am acting as transcriptionist for [employer], DO.   I have reviewed the above documentation for accuracy and completeness, and I agree with the above. Marjory Sneddon, D.O.  The [place] was signed into law in 2016 which includes the topic of electronic health records.  This provides immediate access to information in MyChart.  This includes consultation notes, operative notes, office notes, lab results and pathology reports.  If you have any questions about what you read please let us know at your next visit so we can discuss your concerns and take corrective action if need be.  We are right here with you.

## 2022-08-07 ENCOUNTER — Ambulatory Visit (INDEPENDENT_AMBULATORY_CARE_PROVIDER_SITE_OTHER): Payer: BC Managed Care – PPO | Admitting: Family Medicine

## 2022-08-23 ENCOUNTER — Ambulatory Visit
Admission: RE | Admit: 2022-08-23 | Discharge: 2022-08-23 | Disposition: A | Discharge status: Home / Self Care | Payer: BC Managed Care – PPO | Source: Ambulatory Visit | Attending: Plastic Surgery | Admitting: Plastic Surgery

## 2022-08-23 DIAGNOSIS — R21 Rash and other nonspecific skin eruption: Secondary | ICD-10-CM | POA: Insufficient documentation

## 2022-08-23 DIAGNOSIS — Z1231 Encounter for screening mammogram for malignant neoplasm of breast: Secondary | ICD-10-CM | POA: Insufficient documentation

## 2022-08-23 DIAGNOSIS — N62 Hypertrophy of breast: Secondary | ICD-10-CM | POA: Diagnosis not present

## 2022-08-28 ENCOUNTER — Telehealth: Payer: Self-pay

## 2022-08-28 ENCOUNTER — Ambulatory Visit: Payer: BC Managed Care – PPO | Admitting: Plastic Surgery

## 2022-08-28 NOTE — Telephone Encounter (Signed)
Uploaded documents for cpt code [phone] EKB#[id number]

## 2022-09-13 ENCOUNTER — Ambulatory Visit: Payer: BC Managed Care – PPO | Admitting: Plastic Surgery

## 2022-09-13 ENCOUNTER — Encounter: Payer: Self-pay | Admitting: Plastic Surgery

## 2022-09-13 VITALS — BP 134/81 | HR 100 | Ht 62.0 in | Wt 241.6 lb

## 2022-09-13 DIAGNOSIS — Z6841 Body Mass Index (BMI) 40.0 and over, adult: Secondary | ICD-10-CM

## 2022-09-13 DIAGNOSIS — M546 Pain in thoracic spine: Secondary | ICD-10-CM | POA: Diagnosis not present

## 2022-09-13 DIAGNOSIS — D682 Hereditary deficiency of other clotting factors: Secondary | ICD-10-CM

## 2022-09-13 DIAGNOSIS — N62 Hypertrophy of breast: Secondary | ICD-10-CM | POA: Diagnosis not present

## 2022-09-13 DIAGNOSIS — M542 Cervicalgia: Secondary | ICD-10-CM | POA: Diagnosis not present

## 2022-09-13 NOTE — Progress Notes (Signed)
Referring Provider Juline Patch, MD [address] [place],  [place] [address]   CC:  Chief Complaint  Patient presents with   Advice Only      Krista Harrison is an 61 y.o. female.  HPI: Krista Harrison is a 61 year old female who is scheduled for a bilateral breast reduction.  She is previously seen by another surgeon in our practice and has been approved for the procedure.  She still complains of upper back and neck pain.  She relates that she has been on a diet and exercise program and has been happy with her recent weight loss.  No change in her upper back and neck pain due to this weight loss.  On discussion today she also relates a history of factor V heterozygous deficiency she has never had any issues in the past other than headaches while she was taking birth control pills many years ago.  She was evaluated by neurology at that time and told to stop taking her birth control pills and her headaches resolved.  Nuys any issues with DVTs or PEs with any prior surgery including a hysterectomy and bilateral shoulder arthroscopy.  No Known Allergies  Outpatient Encounter Medications as of 09/13/2022  Medication Sig   ALPRAZolam (XANAX) 0.25 MG tablet Take 1 tablet (0.25 mg total) by mouth 2 (two) times daily as needed for anxiety.   esomeprazole (NEXIUM) 20 MG capsule Take by mouth.   lisinopril-hydrochlorothiazide (ZESTORETIC) 10-12.5 MG tablet Take 1 tablet by mouth daily.   Vitamin D, Ergocalciferol, (DRISDOL) 1.25 MG (50000 UNIT) CAPS capsule Take 1 capsule (50,000 Units total) by mouth every 7 (seven) days.   [DISCONTINUED] sertraline (ZOLOFT) 50 MG tablet Take 1 tablet (50 mg total) by mouth daily.   No facility-administered encounter medications on file as of 09/13/2022.     Past Medical History:  Diagnosis Date   Anxiety    Edema of both lower extremities    Factor V Leiden ([place])    heterozygous   GERD (gastroesophageal reflux disease)    Hypertension     Large breasts    Obesity     Past Surgical History:  Procedure Laterality Date   ABDOMINAL HYSTERECTOMY  2009   ROTATOR CUFF REPAIR Bilateral 2005, 2006    Family History  Problem Relation Age of Onset   Cancer Mother        ovarian   Breast cancer Mother 36   COPD Father     Social History   Social History Narrative   Not on file     Review of Systems General: Denies fevers, chills, weight loss CV: Denies chest pain, shortness of breath, palpitations Breast: Patient notes upper back and neck pain due to the large size of her breast.  She also notes that her right breast is larger than her left breast.  Physical Exam    09/13/2022    9:43 AM 07/13/2022    3:03 PM 06/29/2022   10:49 AM  Vitals with BMI  Height '5\' 2"'$  '5\' 1"'$    Weight 241 lbs 10 oz 240 lbs 249 lbs 13 oz  BMI 44.18 69.48 54.62  Systolic 703 500   Diastolic 81 78   Pulse 938 86     General:  No acute distress,  Alert and oriented, Non-Toxic, Normal speech and affect Breast: Bilateral symptomatic macromastia.  No obvious nipple abnormalities retraction or discharge.  No obvious masses on exam. Mammogram: Mammogram performed November 2023 was BI-RADS 1. Assessment/Plan  Symptomatic macromastia: Patient is already scheduled for surgery.  We did discuss at length the risks of bleeding infection and seroma formation.  We also discussed the risks of nipple loss due to nipple ischemia.  He understands that she is a slightly higher risk of this due to her weight and the large size of her breast.  She asks that her breast be "as small as possible".  We had an extensive discussion regarding physical limitations postoperatively.  Her limitations include no heavy lifting greater than 20 pounds, no vigorous exercise, no submerging the incisions in water for 6 weeks.  The may return to work and light activity as soon as she feels comfortable.  She may start driving as soon as she has been off narcotic pain medicine for 24  hours.  She specifically asked about flying after surgery.  This would be fine as long as she follows the recommendations of hydration and ambulation while flying.  Will place an order for a hematology consult for recommendations for her factor V deficiency.  Krista Harrison 09/13/2022, 10:43 AM

## 2022-09-14 ENCOUNTER — Telehealth: Payer: Self-pay

## 2022-09-14 ENCOUNTER — Telehealth: Payer: Self-pay | Admitting: Physician Assistant

## 2022-09-14 ENCOUNTER — Ambulatory Visit (INDEPENDENT_AMBULATORY_CARE_PROVIDER_SITE_OTHER): Payer: BC Managed Care – PPO | Admitting: Family Medicine

## 2022-09-14 NOTE — Telephone Encounter (Signed)
Spoke with patient confirming new patient appointment 12/6

## 2022-09-14 NOTE — Telephone Encounter (Signed)
Faxed surgical clearance to Dede Query, PA-C

## 2022-09-18 ENCOUNTER — Encounter: Payer: Self-pay | Admitting: Physician Assistant

## 2022-09-18 ENCOUNTER — Ambulatory Visit (INDEPENDENT_AMBULATORY_CARE_PROVIDER_SITE_OTHER): Payer: BC Managed Care – PPO | Admitting: Physician Assistant

## 2022-09-18 VITALS — BP 137/87 | HR 86 | Ht 61.0 in | Wt 240.0 lb

## 2022-09-18 DIAGNOSIS — N62 Hypertrophy of breast: Secondary | ICD-10-CM

## 2022-09-18 MED ORDER — OXYCODONE HCL 5 MG PO TABS: 5.0000 mg | ORAL_TABLET | Freq: Four times a day (QID) | ORAL | 0 refills | Status: AC | PRN

## 2022-09-18 MED ORDER — ONDANSETRON 4 MG PO TBDP: 4.0000 mg | ORAL_TABLET | Freq: Three times a day (TID) | ORAL | 0 refills | Status: DC | PRN

## 2022-09-18 NOTE — Progress Notes (Signed)
Patient ID: Krista Harrison, female    DOB: 27-May-1961, 61 y.o.   MRN: [id number]  Chief Complaint  Patient presents with   Pre-op Exam      ICD-10-CM   1. Macromastia  N62        History of Present Illness: Krista Harrison is a 61 y.o.  female  with a history of macromastia.  She presents for preoperative evaluation for upcoming procedure, bilateral breast reduction, scheduled for 09/26/2022 with Dr.  Lovena Le .  The patient has not had problems with anesthesia.  Previous shoulder surgeries without complication.  Patient reports family history of DVT in both her mother and her father.  They were both provoked DVTs due to cancer and prolonged hospitalization respectively.  She states that she is heterozygous for factor V Leiden and has an appointment with hematology in 2 days.  Discussed likelihood of pharmacologic prophylaxis and she was understanding and agreeable.  She will hold any and all multivitamins and supplements 1 week prior to surgery.  She understands that she cannot take NSAIDs if she is prescribed pharmacologic prophylaxis for DVT.  She confirms that she is a G cup and would like to be as small as possible.  She understands limitations about how much tissue can be removed in order to maintain nipple viability and preserve shape of breasts.  She denies any personal history of cancer, DVT, severe cardiac/pulmonary disease.  Summary of Previous Visit: She was seen for initial consult on 03/31/2022 by Dr. Erin Hearing.  At that time, complained of chronic upper back and neck discomfort in the context of large breasts.  BMI equals 48 kg/m.  Preoperative bra size equals G cup.  STN 35 cm on the right, 35 cm in the left.  Right breast was noted to be much larger.  She then was referred to PT as well as weight loss clinic.  She then met with Dr. Lovena Le on 09/13/2022.  She expressed that she would like her breast to be as "small as possible".  Discussed limitations as well as risks of  surgery.  An order was also placed for hematology consult for recommendations given her factor V deficiency as clearance would be required.  Per chart, she has a new patient appointment scheduled for 09/20/2022 with heme-onc.    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  PMH Significant for: Macromastia, HTN, factor V Leiden, HLD, obesity, GERD.   Past Medical History: Allergies: No Known Allergies  Current Medications:  Current Outpatient Medications:    ALPRAZolam (XANAX) 0.25 MG tablet, Take 1 tablet (0.25 mg total) by mouth 2 (two) times daily as needed for anxiety., Disp: 20 tablet, Rfl: 0   esomeprazole (NEXIUM) 20 MG capsule, Take by mouth., Disp: , Rfl:    lisinopril-hydrochlorothiazide (ZESTORETIC) 10-12.5 MG tablet, Take 1 tablet by mouth daily., Disp: 90 tablet, Rfl: 0   ondansetron (ZOFRAN-ODT) 4 MG disintegrating tablet, Take 1 tablet (4 mg total) by mouth every 8 (eight) hours as needed for nausea or vomiting., Disp: 20 tablet, Rfl: 0   oxyCODONE (ROXICODONE) 5 MG immediate release tablet, Take 1 tablet (5 mg total) by mouth every 6 (six) hours as needed for up to 5 days for severe pain., Disp: 20 tablet, Rfl: 0   Vitamin D, Ergocalciferol, (DRISDOL) 1.25 MG (50000 UNIT) CAPS capsule, Take 1 capsule (50,000 Units total) by mouth every 7 (seven) days., Disp: 4 capsule, Rfl: 0  Past Medical Problems:  Past Medical History:  Diagnosis Date   Anxiety    Edema of both lower extremities    Factor V Leiden ([place])    heterozygous   GERD (gastroesophageal reflux disease)    Hypertension    Large breasts    Obesity     Past Surgical History: Past Surgical History:  Procedure Laterality Date   ABDOMINAL HYSTERECTOMY  2009   ROTATOR CUFF REPAIR Bilateral 2005, 2006    Social History: Social History   Socioeconomic History   Marital status: Married    Spouse name: jeff   Number of children: Not on file   Years of education: Not on file    Highest education level: Not on file  Occupational History   Occupation: Air cabin crew  Tobacco Use   Smoking status: Never   Smokeless tobacco: Never  Vaping Use   Vaping Use: Never used  Substance and Sexual Activity   Alcohol use: Yes    Alcohol/week: 0.0 standard drinks of alcohol    Comment: socially   Drug use: No   Sexual activity: Yes    Birth control/protection: Surgical  Other Topics Concern   Not on file  Social History Narrative   Not on file   Social Determinants of Health   Financial Resource Strain: Not on file  Food Insecurity: Not on file  Transportation Needs: Not on file  Physical Activity: Not on file  Stress: Not on file  Social Connections: Not on file  Intimate Partner Violence: Not on file    Family History: Family History  Problem Relation Age of Onset   Cancer Mother        ovarian   Breast cancer Mother 64   COPD Father     Review of Systems: ROS Denies any recent chest pain, Diffley breathing, leg swelling, fevers, or trauma.  Physical Exam: Vital Signs BP 137/87 (BP Location: Left Arm, Patient Position: Sitting, Cuff Size: Large)   Pulse 86   Ht _0  (1.549 m)   Wt 240 lb (108.9 kg)   SpO2 96%   BMI 45.35 kg/m   Physical Exam Constitutional:      General: Not in acute distress.    Appearance: Normal appearance. Not ill-appearing.  HENT:     Head: Normocephalic and atraumatic.  Eyes:     Pupils: Pupils are equal, round. Cardiovascular:     Rate and Rhythm: Normal rate.    Pulses: Normal pulses.  Pulmonary:     Effort: No respiratory distress or increased work of breathing.  Speaks in full sentences. Abdominal:     General: Abdomen is flat. No distension.   Musculoskeletal: Normal range of motion. No lower extremity swelling or edema. No varicosities. Skin:    General: Skin is warm and dry.     Findings: No erythema or rash.  Neurological:     Mental Status: Alert and oriented to person, place, and time.   Psychiatric:        Mood and Affect: Mood normal.        Behavior: Behavior normal.    Assessment/Plan: The patient is scheduled for bilateral breast reduction with Dr.  Lovena Le .  Risks, benefits, and alternatives of procedure discussed, questions answered and consent obtained.    Smoking Status: Non-smoker. Last Mammogram: 08/2022; Results: BI-RADS Category 1, negative.  Caprini Score: 11; Risk Factors include: Age, BMI greater than 71, factor V Leiden (heterozygous), family history of thrombosis (provoked) in father and mother, and length of planned surgery. Recommendation  for mechanical and possibly pharmacological prophylaxis.  Discussed with Dr. Lovena Le and will appreciate input from hematology about her elevated risk for DVT due to Factor V Leiden, heterozygous type. Encourage early ambulation.   Pictures obtained: 06/29/2022.  Post-op Rx sent to pharmacy: Oxycodone and Zofran.  Patient was provided with the General Surgical Risk consent document and Pain Medication Agreement prior to their appointment.  They had adequate time to read through the risk consent documents and Pain Medication Agreement. We also discussed them in person together during this preop appointment. All of their questions were answered to their satisfaction.  Recommended calling if they have any further questions.  Risk consent form and Pain Medication Agreement to be scanned into patient's chart.  The risk that can be encountered with breast reduction were discussed and include the following but not limited to these:  Breast asymmetry, fluid accumulation, firmness of the breast, inability to breast feed, loss of nipple or areola, skin loss, decrease or no nipple sensation, fat necrosis of the breast tissue, bleeding, infection, healing delay.  There are risks of anesthesia, changes to skin sensation and injury to nerves or blood vessels.  The muscle can be temporarily or permanently injured.  You may have an allergic  reaction to tape, suture, glue, blood products which can result in skin discoloration, swelling, pain, skin lesions, poor healing.  Any of these can lead to the need for revisonal surgery or stage procedures.  A reduction has potential to interfere with diagnostic procedures.  Nipple or breast piercing can increase risks of infection.  This procedure is best done when the breast is fully developed.  Changes in the breast will continue to occur over time.  Pregnancy can alter the outcomes of previous breast reduction surgery, weight gain and weigh loss can also effect the long term appearance.    Electronically signed by: Krista Blue, PA-C 09/18/2022 10:56 AM

## 2022-09-19 ENCOUNTER — Other Ambulatory Visit: Payer: Self-pay

## 2022-09-19 ENCOUNTER — Encounter (HOSPITAL_BASED_OUTPATIENT_CLINIC_OR_DEPARTMENT_OTHER): Payer: Self-pay | Admitting: Plastic Surgery

## 2022-09-20 ENCOUNTER — Encounter (HOSPITAL_BASED_OUTPATIENT_CLINIC_OR_DEPARTMENT_OTHER)
Admission: RE | Admit: 2022-09-20 | Discharge: 2022-09-20 | Disposition: A | Discharge status: Home / Self Care | Payer: BC Managed Care – PPO | Source: Ambulatory Visit | Attending: Plastic Surgery | Admitting: Plastic Surgery

## 2022-09-20 ENCOUNTER — Inpatient Hospital Stay: Payer: BC Managed Care – PPO

## 2022-09-20 ENCOUNTER — Inpatient Hospital Stay: Payer: BC Managed Care – PPO | Attending: Physician Assistant | Admitting: Physician Assistant

## 2022-09-20 VITALS — BP 136/76 | HR 82 | Temp 98.3°F | Resp 18 | Wt 240.1 lb

## 2022-09-20 DIAGNOSIS — D6851 Activated protein C resistance: Secondary | ICD-10-CM | POA: Insufficient documentation

## 2022-09-20 DIAGNOSIS — Z01812 Encounter for preprocedural laboratory examination: Secondary | ICD-10-CM | POA: Diagnosis not present

## 2022-09-20 DIAGNOSIS — Z832 Family history of diseases of the blood and blood-forming organs and certain disorders involving the immune mechanism: Secondary | ICD-10-CM | POA: Diagnosis not present

## 2022-09-20 LAB — CBC WITH DIFFERENTIAL (CANCER CENTER ONLY)
Abs Immature Granulocytes: 0.03 10*3/uL (ref 0.00–0.07)
Basophils Absolute: 0.1 10*3/uL (ref 0.0–0.1)
Basophils Relative: 1 %
Eosinophils Absolute: 0.2 10*3/uL (ref 0.0–0.5)
Eosinophils Relative: 2 %
HCT: 43.6 % (ref 36.0–46.0)
Hemoglobin: 14.6 g/dL (ref 12.0–15.0)
Immature Granulocytes: 0 %
Lymphocytes Relative: 32 %
Lymphs Abs: 3 10*3/uL (ref 0.7–4.0)
MCH: 32.2 pg (ref 26.0–34.0)
MCHC: 33.5 g/dL (ref 30.0–36.0)
MCV: 96 fL (ref 80.0–100.0)
Monocytes Absolute: 0.8 10*3/uL (ref 0.1–1.0)
Monocytes Relative: 9 %
Neutro Abs: 5.3 10*3/uL (ref 1.7–7.7)
Neutrophils Relative %: 56 %
Platelet Count: 316 10*3/uL (ref 150–400)
RBC: 4.54 MIL/uL (ref 3.87–5.11)
RDW: 12.7 % (ref 11.5–15.5)
WBC Count: 9.4 10*3/uL (ref 4.0–10.5)
nRBC: 0 % (ref 0.0–0.2)

## 2022-09-20 LAB — CMP (CANCER CENTER ONLY)
ALT: 16 U/L (ref 0–44)
AST: 13 U/L — ABNORMAL LOW (ref 15–41)
Albumin: 4.5 g/dL (ref 3.5–5.0)
Alkaline Phosphatase: 77 U/L (ref 38–126)
Anion gap: 5 (ref 5–15)
BUN: 25 mg/dL — ABNORMAL HIGH (ref 6–20)
CO2: 34 mmol/L — ABNORMAL HIGH (ref 22–32)
Calcium: 10.3 mg/dL (ref 8.9–10.3)
Chloride: 101 mmol/L (ref 98–111)
Creatinine: 0.8 mg/dL (ref 0.44–1.00)
GFR, Estimated: >60 mL/min (ref >60)
Glucose, Bld: 119 mg/dL — ABNORMAL HIGH (ref 70–99)
Potassium: 4.9 mmol/L (ref 3.5–5.1)
Sodium: 140 mmol/L (ref 135–145)
Total Bilirubin: 0.2 mg/dL — ABNORMAL LOW (ref 0.3–1.2)
Total Protein: 7.5 g/dL (ref 6.5–8.1)

## 2022-09-20 MED ORDER — ENOXAPARIN SODIUM 40 MG/0.4ML IJ SOSY: 40.0000 mg | PREFILLED_SYRINGE | INTRAMUSCULAR | 0 refills | Status: DC

## 2022-09-20 NOTE — Progress Notes (Signed)
[place] [phone]   Fax:([phone]) [place] NOTE  Patient Care Team: Juline Patch, MD as PCP - General (Family Medicine) Christene Lye, MD (General Surgery)   CHIEF COMPLAINTS/PURPOSE OF CONSULTATION:  VTE risk evaluation  HISTORY OF PRESENTING ILLNESS:  Krista Harrison 61 y.o. female with medical history significant for heterozygous factor V leiden mutation, GERD, hypertension, anxiety and obesity presents to the hematology clinic for evaluation of VTE risk with her upcoming breast reduction. She is unaccompanied for this visit.   On exam today, Krista Harrison reports that she is feeling well and has no new or concerning symptoms. She has a good appetite and denies any recent weight changes. She denies easy bruising or signs of active bleeding. She has no personal history of venous thromboembolisms. She denies fevers, chills, sweats, shortness of breath, chest pain or cough. She has no other complaints. Rest of the 10 point ROS is below.   MEDICAL HISTORY:  Past Medical History:  Diagnosis Date   Anxiety    Edema of both lower extremities    Factor V Leiden ([place])    heterozygous   GERD (gastroesophageal reflux disease)    Hypertension    Large breasts    Obesity     SURGICAL HISTORY: Past Surgical History:  Procedure Laterality Date   ABDOMINAL HYSTERECTOMY  2009   ROTATOR CUFF REPAIR Bilateral 2005, 2006    SOCIAL HISTORY: Social History   Socioeconomic History   Marital status: Married    Spouse name: jeff   Number of children: Not on file   Years of education: Not on file   Highest education level: Not on file  Occupational History   Occupation: Air cabin crew  Tobacco Use   Smoking status: Never   Smokeless tobacco: Never  Vaping Use   Vaping Use: Never used  Substance and Sexual Activity   Alcohol use: Yes    Alcohol/week: 0.0 standard drinks of alcohol    Comment: socially   Drug use: No    Sexual activity: Yes    Birth control/protection: Surgical  Other Topics Concern   Not on file  Social History Narrative   Not on file   Social Determinants of Health   Financial Resource Strain: Not on file  Food Insecurity: Not on file  Transportation Needs: Not on file  Physical Activity: Not on file  Stress: Not on file  Social Connections: Not on file  Intimate Partner Violence: Not on file    FAMILY HISTORY: Family History  Problem Relation Age of Onset   Cancer Mother        ovarian   Breast cancer Mother 62   COPD Father     ALLERGIES:  has No Known Allergies.  MEDICATIONS:  Current Outpatient Medications  Medication Sig Dispense Refill   ALPRAZolam (XANAX) 0.25 MG tablet Take 1 tablet (0.25 mg total) by mouth 2 (two) times daily as needed for anxiety. 20 tablet 0   enoxaparin (LOVENOX) 40 MG/0.4ML injection Inject 0.4 mLs (40 mg total) into the skin daily. 12 mL 0   esomeprazole (NEXIUM) 20 MG capsule Take by mouth.     lisinopril-hydrochlorothiazide (ZESTORETIC) 10-12.5 MG tablet Take 1 tablet by mouth daily. 90 tablet 0   Vitamin D, Ergocalciferol, (DRISDOL) 1.25 MG (50000 UNIT) CAPS capsule Take 1 capsule (50,000 Units total) by mouth every 7 (seven) days. 4 capsule 0   ondansetron (ZOFRAN-ODT) 4 MG disintegrating tablet Take 1 tablet (4 mg total)  by mouth every 8 (eight) hours as needed for nausea or vomiting. (Patient not taking: Reported on 09/20/2022) 20 tablet 0   oxyCODONE (ROXICODONE) 5 MG immediate release tablet Take 1 tablet (5 mg total) by mouth every 6 (six) hours as needed for up to 5 days for severe pain. (Patient not taking: Reported on 09/20/2022) 20 tablet 0   No current facility-administered medications for this visit.    REVIEW OF SYSTEMS:   Constitutional: ( - ) fevers, ( - )  chills , ( - ) night sweats Eyes: ( - ) blurriness of vision, ( - ) double vision, ( - ) watery eyes Ears, nose, mouth, throat, and face: ( - ) mucositis, ( - ) sore  throat Respiratory: ( - ) cough, ( - ) dyspnea, ( - ) wheezes Cardiovascular: ( - ) palpitation, ( - ) chest discomfort, ( - ) lower extremity swelling Gastrointestinal:  ( - ) nausea, ( - ) heartburn, ( - ) change in bowel habits Skin: ( - ) abnormal skin rashes Lymphatics: ( - ) new lymphadenopathy, ( - ) easy bruising Neurological: ( - ) numbness, ( - ) tingling, ( - ) new weaknesses Behavioral/Psych: ( - ) mood change, ( - ) new changes  All other systems were reviewed with the patient and are negative.  PHYSICAL EXAMINATION: ECOG PERFORMANCE STATUS: 0 - Asymptomatic  Vitals:   09/20/22 1201  BP: 136/76  Pulse: 82  Resp: 18  Temp: 98.3 F (36.8 C)  SpO2: 98%   Filed Weights   09/20/22 1201  Weight: 240 lb 1 oz (108.9 kg)    GENERAL: well appearing female in NAD  SKIN: skin color, texture, turgor are normal, no rashes or significant lesions EYES: conjunctiva are pink and non-injected, sclera clear LUNGS: clear to auscultation and percussion with normal breathing effort HEART: regular rate & rhythm and no murmurs and no lower extremity edema Musculoskeletal: no cyanosis of digits and no clubbing  PSYCH: alert & oriented x 3, fluent speech NEURO: no focal motor/sensory deficits  LABORATORY DATA:  I have reviewed the data as listed    Latest Ref Rng & Units 09/20/2022    1:03 PM 05/16/2022   10:56 AM 01/16/2022    8:34 AM  CBC  WBC 4.0 - 10.5 K/uL 9.4  8.2  10.1   Hemoglobin 12.0 - 15.0 g/dL 14.6  14.6  14.3   Hematocrit 36.0 - 46.0 % 43.6  43.9  44.4   Platelets 150 - 400 K/uL 316  294  312        Latest Ref Rng & Units 05/16/2022   10:56 AM 01/16/2022    8:34 AM 10/12/2021    8:34 AM  CMP  Glucose 70 - 99 mg/dL 101  139  154   BUN 8 - 27 mg/dL '13  19  17   '$ Creatinine 0.57 - 1.00 mg/dL 0.89  0.89  0.84   Sodium 134 - 144 mmol/L 143  140  141   Potassium 3.5 - 5.2 mmol/L 3.8  5.2  4.8   Chloride 96 - 106 mmol/L 98  100  102   CO2 20 - 29 mmol/L '23  24  25    '$ Calcium 8.7 - 10.3 mg/dL 9.5  9.6  9.3   Total Protein 6.0 - 8.5 g/dL 7.1     Total Bilirubin 0.0 - 1.2 mg/dL 0.3     Alkaline Phos 44 - 121 IU/L 85     AST  0 - 40 IU/L 22     ALT 0 - 32 IU/L 22       ASSESSMENT & PLAN Krista Harrison is a 61 y.o. female who presents to the clinic for evaluation for VTE risk with her upcoming breast reduction surgery. Reviewed factors that increase her risk for VTE include heterozygous factor V leiden mutation, family history of venous thromboembolism, age and obesity. The Caprini VTE score is 9 that puts patient at a high VTE risk. The recommendation is VTE prophylaxis with penumatic compression devices and Lovenox 40 mg injections daily for 30 days.    #VTE risk: --Caprini VTE score = 9 points with 10.7% VTE risk. --Recommend pneumatic compression and Lovenox 40 mg daily x 30 days --I sent Lovenox prescription today as patient is scheduled for surgery on 09/26/2022 --Labs today to check CBC, CMP, Factor 5 Leiden mutation.  --RTC 3 weeks after surgery with labs.    Orders Placed This Encounter  Procedures   CBC with Differential ([facility] Only)    Standing Status:   Future    Number of Occurrences:   1    Standing Expiration Date:   09/21/2023   CMP ([facility] only)    Standing Status:   Future    Number of Occurrences:   1    Standing Expiration Date:   09/21/2023   Factor 5 Leiden*    Standing Status:   Future    Number of Occurrences:   1    Standing Expiration Date:   09/20/2023    All questions were answered. The patient knows to call the clinic with any problems, questions or concerns.  I have spent a total of 60 minutes minutes of face-to-face and non-face-to-face time, preparing to see the patient, obtaining and/or reviewing separately obtained history, performing a medically appropriate examination, counseling and educating the patient, ordering medications/tests/procedures,documenting clinical information in the electronic  health record,and care coordination.   Dede Query, PA-C Department of Hematology/Oncology [place] at [facility] Phone: [phone]  Patient was seen with Dr. Lorenso Courier  I have read the above note and personally examined the patient. I agree with the assessment and plan as noted above.  Briefly Mrs. Krista Harrison is a 61 year old female who presents for recommendations regarding VTE prophylaxis prior to breast reduction surgery.  The patient has a history of heterozygous factor V Leiden.  Given her risk factors she has a Caprini score of 9, which is consistent with a 10.7% risk of VTE following her surgery.  Given these findings would recommend prophylactic dose Lovenox for 30 days following her surgery.  We have called in the prescription to her pharmacy for this preventative dose.  We also encourage early ambulation after surgery as well as pneumatic compression during her hospitalization.  The patient voiced understanding of our plans moving forward.  Will have the patient return to clinic approximately 3 weeks after surgery to discuss discontinuation of her anticoagulation.   Ledell Peoples, MD Department of Hematology/Oncology [place] at [facility] Phone: [phone] Pager: [phone] Email: [email or web address]

## 2022-09-21 ENCOUNTER — Telehealth: Payer: Self-pay | Admitting: Physician Assistant

## 2022-09-21 NOTE — Telephone Encounter (Signed)
Per 12/6 los called and left message for pt about appointment

## 2022-09-25 LAB — FACTOR 5 LEIDEN

## 2022-09-26 ENCOUNTER — Ambulatory Visit (HOSPITAL_BASED_OUTPATIENT_CLINIC_OR_DEPARTMENT_OTHER): Admit: 2022-09-26 | Payer: BC Managed Care – PPO | Admitting: Plastic Surgery

## 2022-09-26 DIAGNOSIS — I10 Essential (primary) hypertension: Secondary | ICD-10-CM

## 2022-09-26 SURGERY — BREAST REDUCTION WITH LIPOSUCTION
Anesthesia: General | Site: Breast | Laterality: Bilateral

## 2022-09-27 ENCOUNTER — Encounter: Payer: Self-pay | Admitting: Plastic Surgery

## 2022-09-27 ENCOUNTER — Encounter: Payer: BC Managed Care – PPO | Admitting: Physician Assistant

## 2022-09-27 ENCOUNTER — Ambulatory Visit (INDEPENDENT_AMBULATORY_CARE_PROVIDER_SITE_OTHER): Payer: BC Managed Care – PPO | Admitting: Plastic Surgery

## 2022-09-27 VITALS — BP 135/82 | HR 72 | Ht 61.0 in | Wt 240.0 lb

## 2022-09-27 DIAGNOSIS — N62 Hypertrophy of breast: Secondary | ICD-10-CM

## 2022-09-27 DIAGNOSIS — D682 Hereditary deficiency of other clotting factors: Secondary | ICD-10-CM

## 2022-09-27 NOTE — Progress Notes (Signed)
I asked Krista Harrison to return today so I could discuss with her why I had canceled her surgery.  At her initial appointment she disclosed that she had a heterozygous factor V deficiency.  I requested a hematology consult to evaluate her surgical and DVT risk.  They recommended 30 days of Lovenox postoperatively.  Krista Harrison was already had a higher risk due to her BMI.  Adding anticoagulation for 30 days made her risk, at least in my hands, unacceptable.  I elected to cancel this otherwise elective surgery.  I explained this to Krista Harrison.  While she was obviously disappointed I believe she understood that I did this only out of concern for her welfare.  No surgery is planned at this time.

## 2022-10-05 ENCOUNTER — Encounter: Payer: BC Managed Care – PPO | Admitting: Plastic Surgery

## 2022-10-11 ENCOUNTER — Ambulatory Visit: Payer: BC Managed Care – PPO | Admitting: Family Medicine

## 2022-10-19 ENCOUNTER — Encounter: Payer: BC Managed Care – PPO | Admitting: Physician Assistant

## 2022-10-19 ENCOUNTER — Telehealth: Payer: Self-pay

## 2022-10-19 NOTE — Telephone Encounter (Signed)
she has a virtual visit scheduled for a follow up for a planned surgery and how she was doing for post op lovenox  but I see that the surgery was cancelled   can you call her and confirm this.   IT she then shouldn't have started the lovenox injection and we can cancel the appt for tomorrow  Since her surgery has been cancelled by the surgeon  LM for pt not to start lovenox and her virtual visit for tomorrow has been cancelled

## 2022-10-20 ENCOUNTER — Telehealth: Payer: BC Managed Care – PPO | Admitting: Physician Assistant

## 2022-10-21 DIAGNOSIS — J019 Acute sinusitis, unspecified: Secondary | ICD-10-CM | POA: Diagnosis not present

## 2022-11-02 ENCOUNTER — Encounter: Payer: BC Managed Care – PPO | Admitting: Physician Assistant

## 2023-02-01 DIAGNOSIS — L219 Seborrheic dermatitis, unspecified: Secondary | ICD-10-CM | POA: Diagnosis not present

## 2023-02-01 DIAGNOSIS — L209 Atopic dermatitis, unspecified: Secondary | ICD-10-CM | POA: Diagnosis not present

## 2023-02-01 DIAGNOSIS — L821 Other seborrheic keratosis: Secondary | ICD-10-CM | POA: Diagnosis not present

## 2023-02-01 DIAGNOSIS — L570 Actinic keratosis: Secondary | ICD-10-CM | POA: Diagnosis not present

## 2023-02-01 DIAGNOSIS — D225 Melanocytic nevi of trunk: Secondary | ICD-10-CM | POA: Diagnosis not present

## 2023-08-01 ENCOUNTER — Ambulatory Visit
Admission: EM | Admit: 2023-08-01 | Discharge: 2023-08-01 | Disposition: A | Discharge status: Home / Self Care | Payer: BC Managed Care – PPO | Attending: Family Medicine | Admitting: Family Medicine

## 2023-08-01 DIAGNOSIS — R1900 Intra-abdominal and pelvic swelling, mass and lump, unspecified site: Secondary | ICD-10-CM | POA: Insufficient documentation

## 2023-08-01 LAB — WET PREP, GENITAL
Clue Cells Wet Prep HPF POC: NONE SEEN
Sperm: NONE SEEN
Trich, Wet Prep: NONE SEEN
WBC, Wet Prep HPF POC: >10 — AB (ref <10)
Yeast Wet Prep HPF POC: NONE SEEN

## 2023-08-01 LAB — URINALYSIS, W/ REFLEX TO CULTURE (INFECTION SUSPECTED)
Bilirubin Urine: NEGATIVE
Glucose, UA: NEGATIVE mg/dL
Ketones, ur: NEGATIVE mg/dL
Nitrite: NEGATIVE
Protein, ur: NEGATIVE mg/dL
Specific Gravity, Urine: 1.025 (ref 1.005–1.030)
pH: 6.5 (ref 5.0–8.0)

## 2023-08-01 NOTE — ED Triage Notes (Signed)
Pt believes she has a UTI or BV states it feels like her insides are falling out. Denies burning upon urination odor and discharge.

## 2023-08-01 NOTE — ED Provider Notes (Signed)
[facility]    CSN: [id number] Arrival date & time: 08/01/23  1829      History   Chief Complaint Chief Complaint  Patient presents with   Urinary Tract Infection     HPI HPI Krista Harrison is a 62 y.o. female.    Krista Harrison presents for urinary frequency and bladder fullness.  Feels bloated and full.  Had similar sx with BV and UTI in the past and is sure she has one of these. No vaginal discharge or odor. Tried nothing prior to arrival.  Has  not had any antibiotics in last 30 days.   Denies known STI exposure. Patient has had a partial hysterectomy.      Past Medical History:  Diagnosis Date   Anxiety    Edema of both lower extremities    Factor V Leiden (HCC)    heterozygous   GERD (gastroesophageal reflux disease)    Hypertension    Large breasts    Obesity     Patient Active Problem List   Diagnosis Date Noted   At risk for impaired metabolic function 07/13/2022   Obesity, current BMI 45.5 07/13/2022   Morbid obesity (HCC) 10/28/2020   Prediabetes 10/26/2020   Vitamin D deficiency 10/26/2020   Pure hypercholesterolemia 04/04/2018   Rash, skin 04/04/2018   BRCA gene mutation negative 03/21/2017   Essential hypertension 09/25/2016   Gastroesophageal reflux disease 09/25/2016   Acute anxiety 09/25/2016    Past Surgical History:  Procedure Laterality Date   ABDOMINAL HYSTERECTOMY  2009   ROTATOR CUFF REPAIR Bilateral 2005, 2006    OB History     Gravida  3   Para  1   Term  1   Preterm      AB  2   Living  1      SAB  2   IAB      Ectopic      Multiple      Live Births  1        Obstetric Comments  1st Menstrual Cycle:  13  1st Pregnancy:  28           Home Medications    Prior to Admission medications   Medication Sig Start Date End Date Taking? Authorizing Provider  esomeprazole (NEXIUM) 20 MG capsule Take by mouth.   Yes [provider]  Vitamin D, Ergocalciferol, (DRISDOL) 1.25  MG (50000 UNIT) CAPS capsule Take 1 capsule (50,000 Units total) by mouth every 7 (seven) days. 06/21/22  Yes Opalski, Deborah, DO  enoxaparin (LOVENOX) 40 MG/0.4ML injection Inject 0.4 mLs (40 mg total) into the skin daily. 09/20/22 10/20/22  Briant Cedar, PA-C  lisinopril-hydrochlorothiazide (ZESTORETIC) 10-12.5 MG tablet Take 1 tablet by mouth daily. 06/12/22   Duanne Limerick, MD  sertraline (ZOLOFT) 50 MG tablet Take 1 tablet (50 mg total) by mouth daily. 04/24/19 05/01/19  Duanne Limerick, MD    Family History Family History  Problem Relation Age of Onset   Cancer Mother        ovarian   Breast cancer Mother 59   COPD Father     Social History Social History   Tobacco Use   Smoking status: Never   Smokeless tobacco: Never  Vaping Use   Vaping status: Never Used  Substance Use Topics   Alcohol use: Yes    Alcohol/week: 0.0 standard drinks of alcohol    Comment: socially   Drug use: No  Allergies   Patient has no known allergies.   Review of Systems Review of Systems: :negative unless otherwise stated in HPI.      Physical Exam Triage Vital Signs ED Triage Vitals [08/01/23 1914]  Encounter Vitals Group     BP (!) 140/80     Systolic BP Percentile      Diastolic BP Percentile      Pulse Rate 74     Resp 18     Temp 98.7 F (37.1 C)     Temp src      SpO2 100 %     Weight      Height      Head Circumference      Peak Flow      Pain Score      Pain Loc      Pain Education      Exclude from Growth Chart    No data found.  Updated Vital Signs BP (!) 140/80   Pulse 74   Temp 98.7 F (37.1 C)   Resp 18   SpO2 100%   Visual Acuity Right Eye Distance:   Left Eye Distance:   Bilateral Distance:    Right Eye Near:   Left Eye Near:    Bilateral Near:     Physical Exam GEN: well appearing female in no acute distress  CVS: well perfused  RESP: speaking in full sentences without pause  ABD: soft, non-tender, non-distended, no palpable masses   Pelvic exam: normal external genitalia, vulva, VAGINA and CERVIX: normal appearing cervix without discharge or lesions, ADNEXA not able to be assessed adequately due to body habitus. WET MOUNT done - results: negative for pathogens, normal epithelial cells, exam chaperoned by CMA.    UC Treatments / Results  Labs (all labs ordered are listed, but only abnormal results are displayed) Labs Reviewed  WET PREP, GENITAL - Abnormal; Notable for the following components:      Result Value   WBC, Wet Prep HPF POC >10 (*)    All other components within normal limits  URINE CULTURE   URINALYSIS, W/ REFLEX TO CULTURE (INFECTION SUSPECTED) - Abnormal; Notable for the following components:   Hgb urine dipstick TRACE (*)    Leukocytes,Ua TRACE (*)    Non Squamous Epithelial PRESENT (*)    Bacteria, UA RARE (*)    All other components within normal limits    EKG   Radiology No results found.  Procedures Procedures (including critical care time)  Medications Ordered in UC Medications - No data to display  Initial Impression / Assessment and Plan / UC Course  I have reviewed the triage vital signs and the nursing notes.  Pertinent labs & imaging results that were available during my care of the patient were reviewed by me and considered in my medical decision making (see chart for details).      Patient is a 62 y.o.Krista Harrison female  who presents for pelvic discomfort.  Overall patient is well-appearing and afebrile.  Vital signs stable.  UA not consistent with acute cystitis.  Additionally, no hematuria supported on microscopy. Hold antibiotics for now.  Urine culture obtained.  Follow-up sensitivities and start antibiotics, if needed.  Wet prep showing no evidence of yeast vaginitis, bacterial vaginitis or trichomonas.  She does have a lot of WBC on her wet prep.  Vaginal exam performed and no prolapse noted.  Etiology and prognosis uncertain. Return precautions including abdominal pain, fever,  chills, nausea, or vomiting given. She  is to follow up with her gynecologist for possible pelvic ultrasound.    Discussed MDM, treatment plan and plan for follow-up with patient who agrees with plan.     Final Clinical Impressions(s) / UC Diagnoses   Final diagnoses:  Pelvic fullness in female     Discharge Instructions      I did not see evidence of bladder prolapse on my pelvic exam today.  He did not have evidence of a urinary tract infection nor did you have evidence of a bacterial vaginosis/BV.  I sent your urine for culture, someone may call you to start antibiotics.  It may be a good idea to follow-up with your primary care doctor to discuss whether you need to have a pelvic ultrasound performed or not.     ED Prescriptions   None    PDMP not reviewed this encounter.   Katha Cabal, DO 08/03/23 1300

## 2023-08-01 NOTE — Discharge Instructions (Addendum)
I did not see evidence of bladder prolapse on my pelvic exam today.  He did not have evidence of a urinary tract infection nor did you have evidence of a bacterial vaginosis/BV.  I sent your urine for culture, someone may call you to start antibiotics.  It may be a good idea to follow-up with your primary care doctor to discuss whether you need to have a pelvic ultrasound performed or not.

## 2023-08-03 LAB — URINE CULTURE: Culture: <10000 — AB

## 2023-08-08 NOTE — Plan of Care (Signed)
[facility] Tonsillectomy/Adenoidectomy, Postoperative PEDS care plan entered in error.

## 2024-02-15 ENCOUNTER — Encounter: Payer: Self-pay | Admitting: Family Medicine

## 2024-02-15 ENCOUNTER — Ambulatory Visit: Admitting: Family Medicine

## 2024-02-15 VITALS — BP 142/86 | HR 91 | Resp 16 | Ht 61.0 in | Wt 248.9 lb

## 2024-02-15 DIAGNOSIS — E782 Mixed hyperlipidemia: Secondary | ICD-10-CM | POA: Diagnosis not present

## 2024-02-15 DIAGNOSIS — I10 Essential (primary) hypertension: Secondary | ICD-10-CM

## 2024-02-15 DIAGNOSIS — F410 Panic disorder [episodic paroxysmal anxiety] without agoraphobia: Secondary | ICD-10-CM

## 2024-02-15 DIAGNOSIS — Z6841 Body Mass Index (BMI) 40.0 and over, adult: Secondary | ICD-10-CM

## 2024-02-15 DIAGNOSIS — R7303 Prediabetes: Secondary | ICD-10-CM

## 2024-02-15 MED ORDER — HYDROXYZINE HCL 10 MG PO TABS: ORAL_TABLET | ORAL | 1 refills | Status: DC

## 2024-02-15 MED ORDER — LISINOPRIL 10 MG PO TABS: 10.0000 mg | ORAL_TABLET | Freq: Every day | ORAL | 1 refills | Status: AC

## 2024-02-15 NOTE — Progress Notes (Signed)
 Date:  02/15/2024   Name:  Krista Harrison   DOB:  May 26, 1961   MRN:  [id number]   Chief Complaint: Obesity (Getting Zebound through a spa wants a med that insurance will cover.  ), Hypertension (Need refills ), and Anxiety (Wants refill on Xanax  )  Hypertension This is a chronic problem. The current episode started more than 1 year ago. The problem has been gradually improving since onset. The problem is controlled. Associated symptoms include anxiety. Pertinent negatives include no blurred vision, chest pain, headaches, orthopnea, palpitations, PND or shortness of breath. There are no associated agents to hypertension. Risk factors for coronary artery disease include dyslipidemia (prediabetes). Past treatments include nothing (off meds). The current treatment provides mild improvement. There are no compliance problems.  There is no history of CAD/MI or CVA. There is no history of chronic renal disease, a hypertension causing med or renovascular disease.  Anxiety Presents for follow-up visit. Symptoms include irritability, nausea and nervous/anxious behavior. Patient reports no chest pain, confusion, dizziness, muscle tension, palpitations or shortness of breath.    Hyperlipidemia This is a chronic problem. The current episode started more than 1 year ago. The problem is uncontrolled. Exacerbating diseases include obesity. She has no history of chronic renal disease or liver disease. There are no known factors aggravating her hyperlipidemia. Pertinent negatives include no chest pain, focal sensory loss, focal weakness or shortness of breath. Current antihyperlipidemic treatment includes diet change. The current treatment provides moderate improvement of lipids. There are no compliance problems.   Diabetes She presents for her follow-up diabetic visit. Diabetes type: prediabetes. Hypoglycemia symptoms include nervousness/anxiousness. Pertinent negatives for hypoglycemia include no confusion,  dizziness, headaches, pallor, speech difficulty or tremors. Pertinent negatives for diabetes include no blurred vision, no chest pain, no foot paresthesias, no polydipsia, no polyphagia, no polyuria, no visual change, no weakness and no weight loss. There are no hypoglycemic complications. There are no diabetic complications. Pertinent negatives for diabetic complications include no CVA. Risk factors for coronary artery disease include dyslipidemia. When asked about current treatments, none were reported.    Lab Results  Component Value Date   NA 140 09/20/2022   K 4.9 09/20/2022   CO2 34 (H) 09/20/2022   GLUCOSE 119 (H) 09/20/2022   BUN 25 (H) 09/20/2022   CREATININE 0.80 09/20/2022   CALCIUM 10.3 09/20/2022   EGFR 74 05/16/2022   GFRNONAA >60 09/20/2022   Lab Results  Component Value Date   CHOL 257 (H) 05/16/2022   HDL 62 05/16/2022   LDLCALC 164 (H) 05/16/2022   TRIG 171 (H) 05/16/2022   CHOLHDL 4.1 01/16/2022   Lab Results  Component Value Date   TSH 3.440 05/16/2022   Lab Results  Component Value Date   HGBA1C 6.1 (H) 05/16/2022   Lab Results  Component Value Date   WBC 9.4 09/20/2022   HGB 14.6 09/20/2022   HCT 43.6 09/20/2022   MCV 96.0 09/20/2022   PLT 316 09/20/2022   Lab Results  Component Value Date   ALT 16 09/20/2022   AST 13 (L) 09/20/2022   ALKPHOS 77 09/20/2022   BILITOT 0.2 (L) 09/20/2022   Lab Results  Component Value Date   VD25OH 27.6 (L) 05/16/2022     Review of Systems  Constitutional:  Positive for irritability. Negative for weight loss.  Eyes:  Negative for blurred vision.  Respiratory:  Negative for chest tightness and shortness of breath.   Cardiovascular:  Negative for chest pain, palpitations, orthopnea  and PND.  Gastrointestinal:  Positive for nausea. Negative for abdominal distention and blood in stool.  Endocrine: Negative for polydipsia, polyphagia and polyuria.  Skin:  Negative for pallor.  Neurological:  Negative for  dizziness, tremors, focal weakness, speech difficulty, weakness and headaches.  Psychiatric/Behavioral:  Negative for confusion. The patient is nervous/anxious.     Patient Active Problem List   Diagnosis Date Noted   At risk for impaired metabolic function 07/13/2022   Obesity, current BMI 45.5 07/13/2022   Morbid obesity (HCC) 10/28/2020   Prediabetes 10/26/2020   Vitamin D  deficiency 10/26/2020   Pure hypercholesterolemia 04/04/2018   Rash, skin 04/04/2018   BRCA gene mutation negative 03/21/2017   Essential hypertension 09/25/2016   Gastroesophageal reflux disease 09/25/2016   Acute anxiety 09/25/2016    No Known Allergies  Past Surgical History:  Procedure Laterality Date   ABDOMINAL HYSTERECTOMY  2009   ROTATOR CUFF REPAIR Bilateral 2005, 2006    Social History   Tobacco Use   Smoking status: Never    Passive exposure: Never   Smokeless tobacco: Never  Vaping Use   Vaping status: Never Used  Substance Use Topics   Alcohol use: Yes    Alcohol/week: 0.0 standard drinks of alcohol    Comment: socially   Drug use: No     Medication list has been reviewed and updated.  No outpatient medications have been marked as taking for the 02/15/24 encounter (Office Visit) with Clarise Crooks, MD.       02/15/2024    8:34 AM 06/12/2022    3:08 PM 10/12/2021    8:05 AM 12/17/2020    8:22 AM  GAD 7 : Generalized Anxiety Score  Nervous, Anxious, on Edge 1 0 0 2  Control/stop worrying 1 0 0 1  Worry too much - different things 2 1 0 0  Trouble relaxing 0 1 0 0  Restless 0 0 0 0  Easily annoyed or irritable 0 0 0 0  Afraid - awful might happen 0 0 0 0  Total GAD 7 Score 4 2 0 3  Anxiety Difficulty Not difficult at all Not difficult at all Not difficult at all Not difficult at all       02/15/2024    8:34 AM 06/12/2022    3:08 PM 05/16/2022    9:12 AM  Depression screen PHQ 2/9  Decreased Interest 0 0 2  Down, Depressed, Hopeless 0 0 0  PHQ - 2 Score 0 0 2  Altered  sleeping 2 0 1  Tired, decreased energy 1 0 1  Change in appetite 0 0 1  Feeling bad or failure about yourself  0 0 0  Trouble concentrating 0 0 0  Moving slowly or fidgety/restless 0 0 1  Suicidal thoughts 0 0 0  PHQ-9 Score 3 0 6  Difficult doing work/chores Not difficult at all Not difficult at all Not difficult at all    BP Readings from Last 3 Encounters:  02/15/24 (!) 142/86  08/01/23 (!) 140/80  09/27/22 135/82    Physical Exam Vitals and nursing note reviewed.  Constitutional:      General: She is not in acute distress.    Appearance: She is not diaphoretic.  HENT:     Head: Normocephalic and atraumatic.     Right Ear: Tympanic membrane, ear canal and external ear normal.     Left Ear: Tympanic membrane, ear canal and external ear normal.     Nose: Nose normal.  Mouth/Throat:     Mouth: Mucous membranes are moist.  Eyes:     General:        Right eye: No discharge.        Left eye: No discharge.     Conjunctiva/sclera: Conjunctivae normal.     Pupils: Pupils are equal, round, and reactive to light.  Neck:     Thyroid : No thyromegaly.     Vascular: No JVD.  Cardiovascular:     Rate and Rhythm: Normal rate and regular rhythm.     Heart sounds: Normal heart sounds. No murmur heard.    No friction rub. No gallop.  Pulmonary:     Effort: Pulmonary effort is normal.     Breath sounds: Normal breath sounds. No wheezing, rhonchi or rales.  Abdominal:     General: Bowel sounds are normal.     Palpations: Abdomen is soft. There is no mass.     Tenderness: There is no abdominal tenderness. There is no guarding or rebound.  Musculoskeletal:        General: Normal range of motion.     Cervical back: Normal range of motion and neck supple.  Lymphadenopathy:     Cervical: No cervical adenopathy.  Skin:    General: Skin is warm and dry.  Neurological:     Mental Status: She is alert.     Deep Tendon Reflexes: Reflexes are normal and symmetric.     Wt Readings  from Last 3 Encounters:  02/15/24 248 lb 14.4 oz (112.9 kg)  09/27/22 240 lb (108.9 kg)  09/20/22 240 lb 1 oz (108.9 kg)    BP (!) 142/86   Pulse 91   Resp 16   Ht 5\' 1"  (1.549 m)   Wt 248 lb 14.4 oz (112.9 kg)   SpO2 97%   BMI 47.03 kg/m   Assessment and Plan:  1. Essential hypertension (Primary) Chronic.  Uncontrolled.  Stable.  Blood pressure 142/86.  Patient has been off her medication and we will be resuming meds and since there is an issue with prediabetes she was on lisinopril  hydrochlorothiazide  and we will resume only the lisinopril  initially and recheck blood pressure in a 90-day.  In the meantime we will check CMP for electrolytes and GFR.  And patient's have been given instructions for arranging follow-up with new physician. - lisinopril  (ZESTRIL ) 10 MG tablet; Take 1 tablet (10 mg total) by mouth daily.  Dispense: 90 tablet; Refill: 1 - Comprehensive metabolic panel with GFR  2. Prediabetes Chronic.  Controlled.  Stable.  Patient had A1c in the 6.1 area a year ago however she has gained more weight and at this point in time she may have evolved into a diabetic A1c range.  In the meantime she has been taking Zepbound by an outside practitioner for weight loss which may be assisting in bringing this down but I am not sure if this is sustainable given her insurance.  We will check her A1c to see what her current status is and adjust accordingly. - lisinopril  (ZESTRIL ) 10 MG tablet; Take 1 tablet (10 mg total) by mouth daily.  Dispense: 90 tablet; Refill: 1 - Hemoglobin A1c  3. Mixed hyperlipidemia Chronic.  Controlled.  Stable.  A1c's have been elevated in the past which has affected her triglycerides and she also has been noted to have elevated LDL.  I have given her a low-cholesterol low triglyceride dietary guidelines and will check lipid panel for current level of control. - Lipid Panel  With LDL/HDL Ratio  4. Adult BMI 40.0-44.9 kg/sq m (HCC) A1c in concerning range  greater than 40 we will check a TSH and T4 to see if there is an issue along with the aforementioned lab request. - TSH + free T4  5. Panic disorder Patient has problem with anxiety and was given some benzos diazepines in the past.  We would like to avoid the send I have offered her hydroxyzine 10 mg on a as needed basis or if she is having 1 of those best nights. - hydrOXYzine (ATARAX) 10 MG tablet; One per 24 hours prn panic attack  Dispense: 30 tablet; Refill: 1    Alayne Allis, MD

## 2024-02-15 NOTE — Patient Instructions (Signed)

## 2024-02-16 LAB — COMPREHENSIVE METABOLIC PANEL WITH GFR
ALT: 25 IU/L (ref 0–32)
AST: 19 IU/L (ref 0–40)
Albumin: 4.8 g/dL (ref 3.9–4.9)
Alkaline Phosphatase: 100 IU/L (ref 44–121)
BUN/Creatinine Ratio: 17 (ref 12–28)
BUN: 15 mg/dL (ref 8–27)
Bilirubin Total: <0.2 mg/dL (ref 0.0–1.2)
CO2: 23 mmol/L (ref 20–29)
Calcium: 9.7 mg/dL (ref 8.7–10.3)
Chloride: 100 mmol/L (ref 96–106)
Creatinine, Ser: 0.86 mg/dL (ref 0.57–1.00)
Globulin, Total: 2.7 g/dL (ref 1.5–4.5)
Glucose: 106 mg/dL — ABNORMAL HIGH (ref 70–99)
Potassium: 5.2 mmol/L (ref 3.5–5.2)
Sodium: 140 mmol/L (ref 134–144)
Total Protein: 7.5 g/dL (ref 6.0–8.5)
eGFR: 76 mL/min/{1.73_m2} (ref >59)

## 2024-02-16 LAB — LIPID PANEL WITH LDL/HDL RATIO
Cholesterol, Total: 220 mg/dL — ABNORMAL HIGH (ref 100–199)
HDL: 57 mg/dL (ref >39)
LDL Chol Calc (NIH): 132 mg/dL — ABNORMAL HIGH (ref 0–99)
LDL/HDL Ratio: 2.3 ratio (ref 0.0–3.2)
Triglycerides: 177 mg/dL — ABNORMAL HIGH (ref 0–149)
VLDL Cholesterol Cal: 31 mg/dL (ref 5–40)

## 2024-02-16 LAB — TSH+FREE T4
Free T4: 1.04 ng/dL (ref 0.82–1.77)
TSH: 2.83 u[IU]/mL (ref 0.450–4.500)

## 2024-02-16 LAB — HEMOGLOBIN A1C
Est. average glucose Bld gHb Est-mCnc: 134 mg/dL
Hgb A1c MFr Bld: 6.3 % — ABNORMAL HIGH (ref 4.8–5.6)

## 2024-02-17 ENCOUNTER — Encounter: Payer: Self-pay | Admitting: Family Medicine

## 2024-02-28 ENCOUNTER — Telehealth: Payer: Self-pay

## 2024-02-28 NOTE — Telephone Encounter (Signed)
 Copied from CRM [id number]. Topic: Clinical - Medication Question >> Feb 28, 2024  2:25 PM Kevelyn M wrote: Reason for CRM: Patient called in about the diuretic that normally comes with Lisinopril . That prescription did not come with Lisinopril  prescription this time when she went to pick it up. Please call [phone] or respond in Mychart.

## 2024-02-29 ENCOUNTER — Encounter: Payer: Self-pay | Admitting: Family Medicine

## 2024-02-29 NOTE — Telephone Encounter (Signed)
 Noted  Pt sent Mychart message as well.  KP

## 2024-03-11 ENCOUNTER — Other Ambulatory Visit: Payer: Self-pay | Admitting: Family Medicine

## 2024-03-11 DIAGNOSIS — F410 Panic disorder [episodic paroxysmal anxiety] without agoraphobia: Secondary | ICD-10-CM

## 2024-03-21 DIAGNOSIS — J069 Acute upper respiratory infection, unspecified: Secondary | ICD-10-CM | POA: Diagnosis not present

## 2024-03-26 ENCOUNTER — Ambulatory Visit (INDEPENDENT_AMBULATORY_CARE_PROVIDER_SITE_OTHER)

## 2024-03-26 ENCOUNTER — Ambulatory Visit
Admission: EM | Admit: 2024-03-26 | Discharge: 2024-03-26 | Disposition: A | Discharge status: Home / Self Care | Attending: Family Medicine | Admitting: Family Medicine

## 2024-03-26 DIAGNOSIS — I10 Essential (primary) hypertension: Secondary | ICD-10-CM

## 2024-03-26 DIAGNOSIS — R051 Acute cough: Secondary | ICD-10-CM

## 2024-03-26 DIAGNOSIS — R062 Wheezing: Secondary | ICD-10-CM | POA: Diagnosis not present

## 2024-03-26 MED ORDER — ALBUTEROL SULFATE HFA 108 (90 BASE) MCG/ACT IN AERS: 1.0000 | INHALATION_SPRAY | Freq: Four times a day (QID) | RESPIRATORY_TRACT | 1 refills | Status: AC | PRN

## 2024-03-26 MED ORDER — HYDROCOD POLI-CHLORPHE POLI ER 10-8 MG/5ML PO SUER: 5.0000 mL | Freq: Two times a day (BID) | ORAL | 0 refills | Status: AC | PRN

## 2024-03-26 MED ORDER — METHYLPREDNISOLONE 4 MG PO TBPK: ORAL_TABLET | ORAL | 0 refills | Status: AC

## 2024-03-26 NOTE — Discharge Instructions (Addendum)
 Be aware, your cough medication may cause drowsiness. Please do not drive, operate heavy machinery or make important decisions while on this medication, it can cloud your judgement. Hold your hydroxyzine  while taking this cough medication.  Your blood pressure was noted to be elevated during your visit today. If you are currently taking medication for high blood pressure, please ensure you are taking this as directed. If you do not have a history of high blood pressure and your blood pressure remains persistently elevated, you may need to begin taking a medication at some point. You may return here within the next few days to recheck if unable to see your primary care provider or if you do not have a one.  BP (!) 163/100 (BP Location: Left Arm)   Pulse (!) 107   Temp 98.3 F (36.8 C) (Oral)   Resp 16   SpO2 94%   BP Readings from Last 3 Encounters:  03/26/24 (!) 163/100  02/15/24 (!) 142/86  08/01/23 (!) 140/80

## 2024-03-26 NOTE — ED Triage Notes (Signed)
 Pt reports she has a cough x 1 week    Took left over amoxicillin

## 2024-03-26 NOTE — ED Provider Notes (Signed)
 [facility]ENTER   [id number] 03/26/24 Arrival Time: 5784  ASSESSMENT & PLAN:  1. Acute cough   2. Wheezing   3. Elevated blood pressure reading with diagnosis of hypertension    I have personally viewed and independently interpreted the imaging studies ordered this visit. CXR: no acute changes.  Likely post-viral cough but significant and with wheezing. Meds ordered this encounter  Medications   methylPREDNISolone (MEDROL DOSEPAK) 4 MG TBPK tablet    Sig: Take as directed.    Dispense:  1 each    Refill:  0   chlorpheniramine-HYDROcodone (TUSSIONEX) 10-8 MG/5ML    Sig: Take 5 mLs by mouth every 12 (twelve) hours as needed for cough.    Dispense:  70 mL    Refill:  0   albuterol  (VENTOLIN  HFA) 108 (90 Base) MCG/ACT inhaler    Sig: Inhale 1-2 puffs into the lungs every 6 (six) hours as needed for wheezing or shortness of breath.    Dispense:  6.7 each    Refill:  1     Discharge Instructions      Be aware, your cough medication may cause drowsiness. Please do not drive, operate heavy machinery or make important decisions while on this medication, it can cloud your judgement. Hold your hydroxyzine  while taking this cough medication.  Your blood pressure was noted to be elevated during your visit today. If you are currently taking medication for high blood pressure, please ensure you are taking this as directed. If you do not have a history of high blood pressure and your blood pressure remains persistently elevated, you may need to begin taking a medication at some point. You may return here within the next few days to recheck if unable to see your primary care provider or if you do not have a one.  BP (!) 163/100 (BP Location: Left Arm)   Pulse (!) 107   Temp 98.3 F (36.8 C) (Oral)   Resp 16   SpO2 94%   BP Readings from Last 3 Encounters:  03/26/24 (!) 163/100  02/15/24 (!) 142/86  08/01/23 (!) 140/80     Work note provided.   Follow-up Information      Schedule an appointment as soon as possible for a visit  with Clarise Crooks, MD.   Specialty: Family Medicine Why: To recheck your blood pressure. Contact information: [address] [place] [address] [phone]         [facility]).   Specialty: Urgent Care Why: If worsening or failing to improve as anticipated. Contact information: [address] [place] [address] [place]  [phone]                Reviewed expectations re: course of current medical issues. Questions answered. Outlined signs and symptoms indicating need for more acute intervention. Understanding verbalized. After Visit Summary given.   SUBJECTIVE: History from: Patient. Krista Harrison is a 63 y.o. female. Pt reports she has a cough x 1 week. Bad cough. Denies SOB. Some CP with coughing. Feels she is wheezing. Online visit; given Rx albuterol  MDI and T Perles. Mild but temp help. Denies fever. Took left over amoxicillin 500mg  TID x 6 days now. Normal PO intake without n/v/d.  Increased blood pressure noted today. Reports that she is treated for HTN. No specific symptoms. Taking medications as directed.  OBJECTIVE:  Vitals:   03/26/24 1011 03/26/24 1013  BP:  (!) 163/100  Pulse:  Aaron Aas)  107  Resp:  16  Temp: 98.3 F (36.8 C)   TempSrc: Oral Oral  SpO2:  94%    General appearance: alert; no distress Eyes: PERRLA; EOMI; conjunctiva normal HENT: [place]; AT; without nasal congestion Neck: supple CV: regular Lungs: speaks full sentences without difficulty; unlabored; coarse breath sounds bilaterally; significant wet and deep cough Extremities: no edema Skin: warm and dry Neurologic: normal gait Psychological: alert and cooperative; normal mood and affect  Imaging: DG Chest 2 View Result Date: 03/26/2024 CLINICAL DATA:  cough x 1 week EXAM: CHEST - 2 VIEW COMPARISON:  None available. FINDINGS: No focal airspace  consolidation, pleural effusion, or pneumothorax. No cardiomegaly. No acute fracture or destructive lesion. Multilevel degenerative disc disease of the spine. IMPRESSION: No acute cardiopulmonary abnormality. Electronically Signed   By: Rance Burrows M.D.   On: 03/26/2024 11:08    No Known Allergies  Past Medical History:  Diagnosis Date   Anxiety    Edema of both lower extremities    Factor V Leiden (HCC)    heterozygous   GERD (gastroesophageal reflux disease)    Hypertension    Large breasts    Obesity    Social History   Socioeconomic History   Marital status: Married    Spouse name: jeff   Number of children: Not on file   Years of education: Not on file   Highest education level: Not on file  Occupational History   Occupation: Data processing manager  Tobacco Use   Smoking status: Never    Passive exposure: Never   Smokeless tobacco: Never  Vaping Use   Vaping status: Never Used  Substance and Sexual Activity   Alcohol use: Yes    Alcohol/week: 0.0 standard drinks of alcohol    Comment: socially   Drug use: No   Sexual activity: Yes    Birth control/protection: Surgical  Other Topics Concern   Not on file  Social History Narrative   Not on file   Social Drivers of Health   Financial Resource Strain: Not on file  Food Insecurity: No Food Insecurity (06/29/2021)   Received from [facility], [facility]   Hunger Vital Sign    Worried About Running Out of Food in the Last Year: Never true    Ran Out of Food in the Last Year: Never true  Transportation Needs: Not on file  Physical Activity: Not on file  Stress: Not on file  Social Connections: Unknown (02/28/2022)   Received from [facility], [facility]   Social Network    Social Network: Not on file  Intimate Partner Violence: Unknown (01/20/2022)   Received from [facility], [facility]   HITS    Physically Hurt: Not on file    Insult or Talk Down To: Not on file    Threaten Physical Harm: Not on  file    Scream or Curse: Not on file   Family History  Problem Relation Age of Onset   Cancer Mother        ovarian   Breast cancer Mother 81   COPD Father    Past Surgical History:  Procedure Laterality Date   ABDOMINAL HYSTERECTOMY  2009   ROTATOR CUFF REPAIR Bilateral 2005, 2006     Afton Albright, MD 03/26/24 1118

## 2024-04-25 ENCOUNTER — Emergency Department

## 2024-04-25 ENCOUNTER — Other Ambulatory Visit: Payer: Self-pay

## 2024-04-25 ENCOUNTER — Emergency Department
Admission: EM | Admit: 2024-04-25 | Discharge: 2024-04-25 | Disposition: A | Discharge status: Home / Self Care | Attending: Emergency Medicine | Admitting: Emergency Medicine

## 2024-04-25 DIAGNOSIS — I10 Essential (primary) hypertension: Secondary | ICD-10-CM | POA: Insufficient documentation

## 2024-04-25 DIAGNOSIS — J9811 Atelectasis: Secondary | ICD-10-CM | POA: Diagnosis not present

## 2024-04-25 DIAGNOSIS — R918 Other nonspecific abnormal finding of lung field: Secondary | ICD-10-CM | POA: Diagnosis not present

## 2024-04-25 DIAGNOSIS — R002 Palpitations: Secondary | ICD-10-CM | POA: Diagnosis not present

## 2024-04-25 DIAGNOSIS — R0602 Shortness of breath: Secondary | ICD-10-CM | POA: Insufficient documentation

## 2024-04-25 DIAGNOSIS — R202 Paresthesia of skin: Secondary | ICD-10-CM | POA: Insufficient documentation

## 2024-04-25 DIAGNOSIS — R42 Dizziness and giddiness: Secondary | ICD-10-CM | POA: Insufficient documentation

## 2024-04-25 DIAGNOSIS — R0789 Other chest pain: Secondary | ICD-10-CM | POA: Insufficient documentation

## 2024-04-25 LAB — BASIC METABOLIC PANEL WITH GFR
Anion gap: 13 (ref 5–15)
BUN: 12 mg/dL (ref 8–23)
CO2: 22 mmol/L (ref 22–32)
Calcium: 9.1 mg/dL (ref 8.9–10.3)
Chloride: 102 mmol/L (ref 98–111)
Creatinine, Ser: 0.8 mg/dL (ref 0.44–1.00)
GFR, Estimated: >60 mL/min (ref >60)
Glucose, Bld: 124 mg/dL — ABNORMAL HIGH (ref 70–99)
Potassium: 3.8 mmol/L (ref 3.5–5.1)
Sodium: 137 mmol/L (ref 135–145)

## 2024-04-25 LAB — CBC
HCT: 44.6 % (ref 36.0–46.0)
Hemoglobin: 14.9 g/dL (ref 12.0–15.0)
MCH: 31.8 pg (ref 26.0–34.0)
MCHC: 33.4 g/dL (ref 30.0–36.0)
MCV: 95.1 fL (ref 80.0–100.0)
Platelets: 329 K/uL (ref 150–400)
RBC: 4.69 MIL/uL (ref 3.87–5.11)
RDW: 13.4 % (ref 11.5–15.5)
WBC: 11.3 K/uL — ABNORMAL HIGH (ref 4.0–10.5)
nRBC: 0 % (ref 0.0–0.2)

## 2024-04-25 LAB — TROPONIN I (HIGH SENSITIVITY)
Troponin I (High Sensitivity): 3 ng/L (ref <18)
Troponin I (High Sensitivity): 3 ng/L (ref <18)

## 2024-04-25 MED ORDER — HYDROXYZINE HCL 25 MG PO TABS
25.0000 mg | ORAL_TABLET | Freq: Once | ORAL | Status: AC
  Administered 2024-04-25: 25 mg via ORAL
  Filled 2024-04-25: qty 1

## 2024-04-25 MED ORDER — LORAZEPAM 1 MG PO TABS
1.0000 mg | ORAL_TABLET | Freq: Once | ORAL | Status: DC
  Filled 2024-04-25: qty 1

## 2024-04-25 MED ORDER — HYDROXYZINE HCL 10 MG PO TABS: 10.0000 mg | ORAL_TABLET | Freq: Three times a day (TID) | ORAL | 0 refills | Status: AC | PRN

## 2024-04-25 NOTE — Discharge Instructions (Addendum)
 Your imaging and laboratory workup was reassuring today with no concerning signs related to your heart or lungs.  This may have been a panic attack or anxiety triggering your symptoms.  I have sent as needed medication to help with anxiety.  If you have any severe or worsening symptoms, please follow-up with your primary care provider or return immediately to the emergency department.

## 2024-04-25 NOTE — ED Triage Notes (Signed)
 Pt reports started to feel dizzy at work and went to check BP and was 160/110. Pt reports dizziness and SOB. Pt reports increased stress at work. Pt reports felt like she had an elephant sitting on her chest.

## 2024-04-25 NOTE — ED Provider Notes (Signed)
 [facility] Provider Note    Event Date/Time   First MD Initiated Contact with Patient 04/25/24 1400     (approximate)   History   Dizziness and Chest Pain   HPI Krista Harrison is a 63 y.o. female with history of HTN, anxiety presenting today for dizziness and chest pain.  Patient states earlier today she had acute onset of an episode where she felt dizzy/lightheaded, chest pressure, shortness of breath.  It led to tingling in her extremities.  She initially went to the fire department where her blood pressure was high and they told her to come to either urgent care or ER.  She states almost complete resolution of all symptoms but still feels woozy.  Able to walk without difficulty.  No syncopal episodes.  Currently denying chest pain, shortness of breath, numbness, tingling, weakness, vision changes, speech changes.  Does have history of anxiety and states she has been under tremendous amount of stress recently.     Physical Exam   Triage Vital Signs: ED Triage Vitals [04/25/24 1158]  Encounter Vitals Group     BP (!) 160/91     Girls Systolic BP Percentile      Girls Diastolic BP Percentile      Boys Systolic BP Percentile      Boys Diastolic BP Percentile      Pulse Rate (!) 112     Resp 20     Temp 98 F (36.7 C)     Temp Source Oral     SpO2 98 %     Weight      Height      Head Circumference      Peak Flow      Pain Score      Pain Loc      Pain Education      Exclude from Growth Chart     Most recent vital signs: Vitals:   04/25/24 1158  BP: (!) 160/91  Pulse: (!) 112  Resp: 20  Temp: 98 F (36.7 C)  SpO2: 98%   Physical Exam: I have reviewed the vital signs and nursing notes. General: Awake, alert, no acute distress.  Nontoxic appearing. Head:  Atraumatic, normocephalic.   ENT:  EOM intact, PERRL. Oral mucosa is pink and moist with no lesions. Neck: Neck is supple with full range of motion,  Cardiovascular:  RRR, No  murmurs. Peripheral pulses palpable and equal bilaterally. Respiratory:  Symmetrical chest wall expansion.  No rhonchi, rales, or wheezes.  Good air movement throughout.  No use of accessory muscles.   Musculoskeletal:  No cyanosis or edema. Moving extremities with full ROM Abdomen:  Soft, nontender, nondistended. Neuro:  GCS 15, moving all four extremities, interacting appropriately. Speech clear.  Cranial nerves II through XII intact.  Sensation equal and intact throughout bilateral upper and lower extremities.  Strength fully intact throughout bilateral upper and lower extremities.  Ambulated in the room without any ataxia signs Psych:  Calm, appropriate.   Skin:  Warm, dry, no rash.    ED Results / Procedures / Treatments   Labs (all labs ordered are listed, but only abnormal results are displayed) Labs Reviewed  BASIC METABOLIC PANEL WITH GFR - Abnormal; Notable for the following components:      Result Value   Glucose, Bld 124 (*)    All other components within normal limits  CBC - Abnormal; Notable for the following components:   WBC 11.3 (*)  All other components within normal limits  TROPONIN I (HIGH SENSITIVITY)  TROPONIN I (HIGH SENSITIVITY)     EKG My EKG interpretation: Rate of 106, sinus tachycardia, right bundle branch block.  No acute ST elevations or depressions   RADIOLOGY Independently interpreted chest x-ray with no acute abnormalities   PROCEDURES:  Critical Care performed: No  Procedures   MEDICATIONS ORDERED IN ED: Medications  hydrOXYzine  (ATARAX ) tablet 25 mg (25 mg Oral Given 04/25/24 1502)     IMPRESSION / MDM / ASSESSMENT AND PLAN / ED COURSE  I reviewed the triage vital signs and the nursing notes.                              Differential diagnosis includes, but is not limited to, anxiety attack, panic attack, electrolyte abnormality, dehydration, lower suspicion CVA or ACS  Patient's presentation is most consistent with acute  complicated illness / injury requiring diagnostic workup.  Patient is a 63 year old female presenting today for acute onset of chest pressure, shortness of breath, palpitations, dizziness, and tingling in her extremities.  Based on her history, it does sound most concerning for anxiety versus panic attack and she does admit to being under a lot of stress recently.  She is also essentially asymptomatic at this time.  EKG shows right bundle branch block with history of the same.  CBC with slight leukocytosis and BMP unremarkable.  Initial troponin normal at 3 but will get repeat.  Chest x-ray unremarkable.  I did discuss getting further imaging of the head given patient still notes intermittent wooziness.  Otherwise her neurological exam is completely negative at this time with no concerning stroke findings.  Patient politely declined additional workup from a CVA or brain standpoint.  Given hydroxyzine  for anxiety.  Second troponin negative.  Reassessed patient and she is feeling fine at this time.  Still does not want any additional imaging of her head.  Will discharge at this time for suspected anxiety/panic attack.  Given strict return precautions.  Otherwise stable at this moment.     FINAL CLINICAL IMPRESSION(S) / ED DIAGNOSES   Final diagnoses:  Dizziness     Rx / DC Orders   ED Discharge Orders          Ordered    hydrOXYzine  (ATARAX ) 10 MG tablet  3 times daily PRN        04/25/24 1607             Note:  This document was prepared using Dragon voice recognition software and may include unintentional dictation errors.   Malvina Alm DASEN, MD 04/25/24 [phone]

## 2024-10-31 NOTE — Progress Notes (Signed)
 Krista Harrison                                          MRN: [id number]   10/31/2024   The [facility] Quality Team Specialist reviewed this patient medical record for the purposes of chart review for care gap closure. The following were reviewed: chart review for care gap closure-controlling blood pressure.    [facility] Quality Team
